# Patient Record
Sex: Male | Born: 1950
Health system: Southern US, Community
[De-identification: ages and names within clinical notes are randomized; demographics above are authoritative.]

## PROBLEM LIST (undated history)

## (undated) DIAGNOSIS — E785 Hyperlipidemia, unspecified: Secondary | ICD-10-CM

## (undated) DIAGNOSIS — I4892 Unspecified atrial flutter: Secondary | ICD-10-CM

## (undated) HISTORY — DX: Unspecified atrial flutter: I48.92

## (undated) HISTORY — DX: Hyperlipidemia, unspecified: E78.5

## (undated) HISTORY — PX: TONSILLECTOMY AND ADENOIDECTOMY: SHX28

---

## 2000-07-09 ENCOUNTER — Ambulatory Visit (HOSPITAL_COMMUNITY): Admission: RE | Admit: 2000-07-09 | Discharge: 2000-07-09 | Payer: Self-pay | Admitting: Pulmonary Disease

## 2001-05-01 ENCOUNTER — Ambulatory Visit (HOSPITAL_COMMUNITY): Admission: RE | Admit: 2001-05-01 | Discharge: 2001-05-01 | Payer: Self-pay | Admitting: Pulmonary Disease

## 2001-07-22 ENCOUNTER — Ambulatory Visit (HOSPITAL_COMMUNITY): Admission: RE | Admit: 2001-07-22 | Discharge: 2001-07-22 | Payer: Self-pay | Admitting: Pulmonary Disease

## 2001-08-11 ENCOUNTER — Encounter: Payer: Self-pay | Admitting: Otolaryngology

## 2001-08-11 ENCOUNTER — Ambulatory Visit (HOSPITAL_COMMUNITY): Admission: RE | Admit: 2001-08-11 | Discharge: 2001-08-11 | Payer: Self-pay | Admitting: Otolaryngology

## 2001-09-16 ENCOUNTER — Ambulatory Visit (HOSPITAL_COMMUNITY): Admission: RE | Admit: 2001-09-16 | Discharge: 2001-09-16 | Payer: Self-pay | Admitting: Pulmonary Disease

## 2002-07-09 ENCOUNTER — Encounter: Payer: Self-pay | Admitting: Internal Medicine

## 2002-07-09 ENCOUNTER — Inpatient Hospital Stay (HOSPITAL_COMMUNITY): Admission: EM | Admit: 2002-07-09 | Discharge: 2002-07-10 | Payer: Self-pay | Admitting: Internal Medicine

## 2004-09-21 ENCOUNTER — Ambulatory Visit (HOSPITAL_COMMUNITY): Admission: RE | Admit: 2004-09-21 | Discharge: 2004-09-21 | Payer: Self-pay | Admitting: Pulmonary Disease

## 2004-10-19 ENCOUNTER — Ambulatory Visit (HOSPITAL_COMMUNITY): Admission: RE | Admit: 2004-10-19 | Discharge: 2004-10-19 | Payer: Self-pay | Admitting: Pulmonary Disease

## 2010-05-23 ENCOUNTER — Other Ambulatory Visit (HOSPITAL_COMMUNITY): Payer: Self-pay | Admitting: Pulmonary Disease

## 2010-05-23 ENCOUNTER — Ambulatory Visit (HOSPITAL_COMMUNITY)
Admission: RE | Admit: 2010-05-23 | Discharge: 2010-05-23 | Disposition: A | Discharge status: Home / Self Care | Payer: PRIVATE HEALTH INSURANCE | Source: Ambulatory Visit | Attending: Pulmonary Disease | Admitting: Pulmonary Disease

## 2010-05-23 DIAGNOSIS — M79646 Pain in unspecified finger(s): Secondary | ICD-10-CM

## 2010-05-23 DIAGNOSIS — M25531 Pain in right wrist: Secondary | ICD-10-CM

## 2010-05-23 DIAGNOSIS — M25539 Pain in unspecified wrist: Secondary | ICD-10-CM | POA: Insufficient documentation

## 2010-05-23 DIAGNOSIS — M79609 Pain in unspecified limb: Secondary | ICD-10-CM | POA: Insufficient documentation

## 2014-02-16 ENCOUNTER — Encounter: Payer: Self-pay | Admitting: *Deleted

## 2014-02-16 DIAGNOSIS — E785 Hyperlipidemia, unspecified: Secondary | ICD-10-CM | POA: Insufficient documentation

## 2014-02-16 DIAGNOSIS — E669 Obesity, unspecified: Secondary | ICD-10-CM | POA: Insufficient documentation

## 2014-02-17 ENCOUNTER — Encounter: Payer: Self-pay | Admitting: Cardiology

## 2014-02-17 ENCOUNTER — Ambulatory Visit (INDEPENDENT_AMBULATORY_CARE_PROVIDER_SITE_OTHER): Payer: Medicare Other | Admitting: Cardiology

## 2014-02-17 VITALS — BP 140/100 | HR 86 | Ht 68.0 in | Wt 257.0 lb

## 2014-02-17 DIAGNOSIS — E785 Hyperlipidemia, unspecified: Secondary | ICD-10-CM

## 2014-02-17 DIAGNOSIS — I4892 Unspecified atrial flutter: Secondary | ICD-10-CM

## 2014-02-17 DIAGNOSIS — I483 Typical atrial flutter: Secondary | ICD-10-CM

## 2014-02-17 DIAGNOSIS — R0602 Shortness of breath: Secondary | ICD-10-CM

## 2014-02-17 DIAGNOSIS — Z7189 Other specified counseling: Secondary | ICD-10-CM

## 2014-02-17 DIAGNOSIS — Z1329 Encounter for screening for other suspected endocrine disorder: Secondary | ICD-10-CM

## 2014-02-17 MED ORDER — FUROSEMIDE 20 MG PO TABS: 20.0000 mg | ORAL_TABLET | Freq: Every day | ORAL | Status: DC

## 2014-02-17 MED ORDER — METOPROLOL TARTRATE 25 MG PO TABS: 25.0000 mg | ORAL_TABLET | Freq: Two times a day (BID) | ORAL | Status: DC

## 2014-02-17 NOTE — Patient Instructions (Signed)
Your physician recommends that you schedule a follow-up appointment in: 3-4 weeks with Dr. Harl Bowie  Your physician has requested that you have an echocardiogram. Echocardiography is a painless test that uses sound waves to create images of your heart. It provides your doctor with information about the size and shape of your heart and how well your heart's chambers and valves are working. This procedure takes approximately one hour. There are no restrictions for this procedure.  Your physician has recommended you make the following change in your medication:   START LOPRESSOR 25 MG TWICE DAILY  START LASIX 20 MG DAILY AS NEEDED FOR SWELLING AND SHORTNESS OF BREATH. TAKE FOR 3 DAYS EVERYDAY THEN TAKE AS NEEDED AFTER  Your physician recommends that you return for lab work in: 1 WEEK BMP/TSH/LIPIDS   Thank you for choosing [facility]!!

## 2014-02-17 NOTE — Progress Notes (Signed)
Clinical Summary Mr. Novosad is a 64 y.o.male seen today as a new patient for the following medical problems.   1. SOB - ongoing for last 7-8 months. Occurs with exertion. [place] trail [place] about [place] mile, notes at 3/4 of the way he has SOB.Also noted recent  [employer]  walking form parking lot to football stadium during recent outing.  - no edema, no orthopnea, no PND. No palpitations. No chest pain  - elevated BNP noted by pcp   2. Hyperlipidemia - muscle aches on zocor. Had stopped lipitor for a period, restarted Nov 2015.  - Last panel May 2015 TC 215 HDL 46 LDL 136   PMH 1. Hyperlipidemia   No Known Allergies   Current Outpatient Prescriptions  Medication Sig Dispense Refill  . aspirin 81 MG tablet Take 81 mg by mouth daily.    Marland Kitchen atorvastatin (LIPITOR) 40 MG tablet Take 40 mg by mouth daily.    . Cholecalciferol (VITAMIN D-3 PO) Take 5,000 Int'l Units by mouth.    . Lecithin 1200 MG CAPS Take 1,200 mg by mouth daily.    Marland Kitchen loratadine (CLARITIN) 10 MG tablet Take 10 mg by mouth daily.    . Misc Natural Products (GINKOGIN PO) Take 240 mg by mouth daily.    . Omega-3 Fatty Acids (FISH OIL) 1000 MG CAPS Take 1,000 mg by mouth daily.    Marland Kitchen omeprazole (PRILOSEC) 20 MG capsule Take 20 mg by mouth daily.    . vitamin C (ASCORBIC ACID) 500 MG tablet Take 500 mg by mouth daily.    . Vitamin E (VITA-PLUS E PO) Take 800 mg by mouth.    . Zinc 50 MG TABS Take 50 mg by mouth daily.    . furosemide (LASIX) 20 MG tablet Take 1 tablet (20 mg total) by mouth daily. 90 tablet 3  . metoprolol tartrate (LOPRESSOR) 25 MG tablet Take 1 tablet (25 mg total) by mouth 2 (two) times daily. 180 tablet 3   No current facility-administered medications for this visit.     No past surgical history on file.   No Known Allergies    Denies family history of cardaic problems.   Social History Mr. Talton reports that he has never smoked. He has never used smokeless  tobacco. Mr. Ankney has no alcohol history on file.   Review of Systems CONSTITUTIONAL: No weight loss, fever, chills, weakness or fatigue.  HEENT: Eyes: No visual loss, blurred vision, double vision or yellow sclerae.No hearing loss, sneezing, congestion, runny nose or sore throat.  SKIN: No rash or itching.  CARDIOVASCULAR: per HPI RESPIRATORY: No cough or sputum.  GASTROINTESTINAL: No anorexia, nausea, vomiting or diarrhea. No abdominal pain or blood.  GENITOURINARY: No burning on urination, no polyuria NEUROLOGICAL: No headache, dizziness, syncope, paralysis, ataxia, numbness or tingling in the extremities. No change in bowel or bladder control.  MUSCULOSKELETAL: No muscle, back pain, joint pain or stiffness.  LYMPHATICS: No enlarged nodes. No history of splenectomy.  PSYCHIATRIC: No history of depression or anxiety.  ENDOCRINOLOGIC: No reports of sweating, cold or heat intolerance. No polyuria or polydipsia.  Marland Kitchen   Physical Examination Filed Vitals:   02/17/14 0843  BP: 140/100  Pulse: 86   Filed Weights   02/17/14 0843  Weight: 257 lb (116.574 kg)    Gen: resting comfortably, no acute distress HEENT: no scleral icterus, pupils equal round and reactive, no palptable cervical adenopathy,  CV: RRR,  no m/r/g, no JVD, no carotid bruits Resp: Clear to auscultation bilaterally GI: abdomen is soft, non-tender, non-distended, normal bowel sounds, no hepatosplenomegaly MSK: extremities are warm, no edema.  Skin: warm, no rash Neuro:  no focal deficits Psych: appropriate affect   Clinic EKG: aflutter  Assessment and Plan  1. SOB - likely partly related to new diagnosis of aflutter - f/u echo results  2. Aflutter - new diagnosis this clinic visit - patient denies any SOB, however baseline heart rates elevated to 112. Likely partly responsible for his SOB - check BMET, TSH, echo - start lopressor 25mg  bid for now, discuss possible ablation at follow up. Therapy strategy  may also be affected pending echo results - CHADS2Vasc score is 1 (age), continue ASA at this time. Pending echo results may classify as higher risk.   3. Hyperlipidemia - continue statin, repeat FLP now that he has been consistently back on statin   F/u 3-4 weeks   Arnoldo Lenis, M.D..

## 2014-02-19 DIAGNOSIS — I4892 Unspecified atrial flutter: Secondary | ICD-10-CM | POA: Insufficient documentation

## 2014-02-24 ENCOUNTER — Other Ambulatory Visit: Payer: Self-pay

## 2014-02-24 ENCOUNTER — Other Ambulatory Visit (INDEPENDENT_AMBULATORY_CARE_PROVIDER_SITE_OTHER): Payer: Medicare Other

## 2014-02-24 DIAGNOSIS — R0602 Shortness of breath: Secondary | ICD-10-CM

## 2014-02-24 DIAGNOSIS — I4892 Unspecified atrial flutter: Secondary | ICD-10-CM

## 2014-02-24 DIAGNOSIS — I483 Typical atrial flutter: Secondary | ICD-10-CM

## 2014-02-25 ENCOUNTER — Encounter: Payer: Self-pay | Admitting: *Deleted

## 2014-02-25 ENCOUNTER — Telehealth: Payer: Self-pay | Admitting: *Deleted

## 2014-02-25 NOTE — Telephone Encounter (Signed)
Letter mailed to pt. Both contact numbers disconnected

## 2014-02-25 NOTE — Telephone Encounter (Signed)
-----   Message from Arnoldo Lenis, MD sent at 02/25/2014 11:46 AM EST ----- Echo looks good, heart functions is normal  Zandra Abts MD

## 2014-03-02 ENCOUNTER — Telehealth: Payer: Self-pay | Admitting: *Deleted

## 2014-03-02 NOTE — Telephone Encounter (Signed)
Pt called to update phone number, corrected number under demographics. Also pt made aware of echo results

## 2014-03-03 ENCOUNTER — Other Ambulatory Visit: Payer: Self-pay | Admitting: Cardiology

## 2014-03-03 LAB — LIPID PANEL
CHOL/HDL RATIO: 4.6 ratio
CHOLESTEROL: 172 mg/dL (ref 0–200)
HDL: 37 mg/dL — ABNORMAL LOW (ref >39)
LDL Cholesterol: 114 mg/dL — ABNORMAL HIGH (ref 0–99)
TRIGLYCERIDES: 103 mg/dL (ref <150)
VLDL: 21 mg/dL (ref 0–40)

## 2014-03-03 LAB — BASIC METABOLIC PANEL
BUN: 21 mg/dL (ref 6–23)
CHLORIDE: 105 meq/L (ref 96–112)
CO2: 27 meq/L (ref 19–32)
CREATININE: 1.03 mg/dL (ref 0.50–1.35)
Calcium: 8.8 mg/dL (ref 8.4–10.5)
Glucose, Bld: 133 mg/dL — ABNORMAL HIGH (ref 70–99)
Potassium: 4.5 mEq/L (ref 3.5–5.3)
SODIUM: 141 meq/L (ref 135–145)

## 2014-03-03 LAB — TSH: TSH: 2.635 u[IU]/mL (ref 0.350–4.500)

## 2014-03-09 ENCOUNTER — Encounter: Payer: Self-pay | Admitting: *Deleted

## 2014-03-10 ENCOUNTER — Encounter: Payer: Self-pay | Admitting: Cardiology

## 2014-03-10 ENCOUNTER — Ambulatory Visit (INDEPENDENT_AMBULATORY_CARE_PROVIDER_SITE_OTHER): Payer: Medicare Other | Admitting: Cardiology

## 2014-03-10 VITALS — BP 131/84 | HR 108 | Ht 68.0 in | Wt 261.0 lb

## 2014-03-10 DIAGNOSIS — I4892 Unspecified atrial flutter: Secondary | ICD-10-CM

## 2014-03-10 MED ORDER — METOPROLOL TARTRATE 50 MG PO TABS: 50.0000 mg | ORAL_TABLET | Freq: Two times a day (BID) | ORAL | Status: DC

## 2014-03-10 NOTE — Patient Instructions (Signed)
Your physician recommends that you schedule a follow-up appointment in: 6 weeks with Dr. Harl Bowie  Your physician has recommended you make the following change in your medication:   INCREASE LOPRESSOR 50 Rossie AS DIRECTED  Thank you for choosing [facility]!!

## 2014-03-10 NOTE — Progress Notes (Signed)
Clinical Summary Mr. Antrim is a 64 y.o.male seen today for follow up of the following medical problems. This is a focused visit on his history of aflutter/afib.   1. Aflutter - noted last visit, started on beta blocker. CHADS2Vasc score of 1, started on ASA.   - echo LVEF 50-55%, mild LVH, could not evaluate diastolic function - TSH 4.25, K 4.5 - still with some exertional dyspnea, no palpitations.     Past Medical History  Diagnosis Date  . Atrial flutter   . Hyperlipidemia      No Known Allergies   Current Outpatient Prescriptions  Medication Sig Dispense Refill  . aspirin 81 MG tablet Take 81 mg by mouth daily.    Marland Kitchen atorvastatin (LIPITOR) 40 MG tablet Take 40 mg by mouth daily.    . Cholecalciferol (VITAMIN D-3 PO) Take 5,000 Int'l Units by mouth.    . furosemide (LASIX) 20 MG tablet Take 1 tablet (20 mg total) by mouth daily. 90 tablet 3  . Lecithin 1200 MG CAPS Take 1,200 mg by mouth daily.    Marland Kitchen loratadine (CLARITIN) 10 MG tablet Take 10 mg by mouth daily.    . metoprolol tartrate (LOPRESSOR) 25 MG tablet Take 1 tablet (25 mg total) by mouth 2 (two) times daily. 180 tablet 3  . Misc Natural Products (GINKOGIN PO) Take 240 mg by mouth daily.    . Omega-3 Fatty Acids (FISH OIL) 1000 MG CAPS Take 1,000 mg by mouth daily.    Marland Kitchen omeprazole (PRILOSEC) 20 MG capsule Take 20 mg by mouth daily.    . vitamin C (ASCORBIC ACID) 500 MG tablet Take 500 mg by mouth daily.    . Vitamin E (VITA-PLUS E PO) Take 800 mg by mouth.    . Zinc 50 MG TABS Take 50 mg by mouth daily.     No current facility-administered medications for this visit.     No past surgical history on file.   No Known Allergies    No family history on file.   Social History Mr. Stillings reports that he has never smoked. He has never used smokeless tobacco. Mr. Sortor has no alcohol history on file.   Review of Systems CONSTITUTIONAL: No weight loss, fever, chills, weakness or fatigue.    HEENT: Eyes: No visual loss, blurred vision, double vision or yellow sclerae.No hearing loss, sneezing, congestion, runny nose or sore throat.  SKIN: No rash or itching.  CARDIOVASCULAR: no chest pain, no palpitaitons.  RESPIRATORY: No cough or sputum.  GASTROINTESTINAL: No anorexia, nausea, vomiting or diarrhea. No abdominal pain or blood.  GENITOURINARY: No burning on urination, no polyuria NEUROLOGICAL: No headache, dizziness, syncope, paralysis, ataxia, numbness or tingling in the extremities. No change in bowel or bladder control.  MUSCULOSKELETAL: No muscle, back pain, joint pain or stiffness.  LYMPHATICS: No enlarged nodes. No history of splenectomy.  PSYCHIATRIC: No history of depression or anxiety.  ENDOCRINOLOGIC: No reports of sweating, cold or heat intolerance. No polyuria or polydipsia.  Marland Kitchen   Physical Examination p 108 bp 131/84 Wt 261 lbs BMI 39 Gen: resting comfortably, no acute distress HEENT: no scleral icterus, pupils equal round and reactive, no palptable cervical adenopathy,  CV: irreg, no m/r/g, no JVD Resp: Clear to auscultation bilaterally GI: abdomen is soft, non-tender, non-distended, normal bowel sounds, no hepatosplenomegaly MSK: extremities are warm, no edema.  Skin: warm, no rash Neuro:  no focal deficits Psych: appropriate affect   Diagnostic Studies  02/2014 Echo Study  Conclusions  - Procedure narrative: Transthoracic echocardiography for left ventricular function evaluation, for right ventricular function evaluation, and for assessment of valvular function. Image quality was suboptimal, with poor valvular and endocardial visualization. Patient was tachycardic throughout exam. - Left ventricle: There was mild concentric hypertrophy. Systolic function was normal. The estimated ejection fraction was in the range of 50% to 55%. Images were inadequate for LV wall motion assessment. The study was not technically sufficient to  allow evaluation of LV diastolic dysfunction due to atrial flutter. - Mitral valve: Mildly calcified annulus. Mildly thickened leaflets . There was trivial regurgitation. - Left atrium: The atrium was mildly dilated. - Tricuspid valve: There was trivial regurgitation.   Assessment and Plan  1. Aflutter - new diagnosis last clinic visit, still with some exertional SOB. Will increase lopressor to 50mg  bid. Pending clinical response will discuss potential ablation at next follow up.  - CHADS2Vasc score is 1 (age), continue ASA at this time. Pending echo results may classify as higher risk.          Arnoldo Lenis, M.D.

## 2014-04-26 ENCOUNTER — Ambulatory Visit: Payer: Medicare Other | Admitting: Cardiology

## 2014-05-17 ENCOUNTER — Ambulatory Visit (INDEPENDENT_AMBULATORY_CARE_PROVIDER_SITE_OTHER): Payer: Medicare Other | Admitting: Cardiology

## 2014-05-17 ENCOUNTER — Encounter: Payer: Self-pay | Admitting: Cardiology

## 2014-05-17 VITALS — BP 143/94 | HR 107 | Temp 98.6°F | Ht 68.0 in | Wt 267.0 lb

## 2014-05-17 DIAGNOSIS — I4891 Unspecified atrial fibrillation: Secondary | ICD-10-CM

## 2014-05-17 DIAGNOSIS — I4892 Unspecified atrial flutter: Secondary | ICD-10-CM

## 2014-05-17 DIAGNOSIS — I483 Typical atrial flutter: Secondary | ICD-10-CM

## 2014-05-17 MED ORDER — METOPROLOL TARTRATE 100 MG PO TABS: 100.0000 mg | ORAL_TABLET | Freq: Two times a day (BID) | ORAL | Status: DC

## 2014-05-17 NOTE — Progress Notes (Signed)
Clinical Summary [facility] is a 64 y.o.male seen today for follow up of the following medical problems.    1. Aflutter/Afib - rate control with lopressor. Last visit increased lopressor to 50mg  bid. CHADS2Vasc score of 1, started on ASA.  - echo LVEF 50-55%, mild LVH, could not evaluate diastolic function - TSH 2.84, K 4.5 - still with some exertional dyspnea, no palpitations.    Past Medical History  Diagnosis Date  . Atrial flutter   . Hyperlipidemia      Allergies  Allergen Reactions  . Cefuroxime Axetil Nausea And Vomiting    Upset stomach     Current Outpatient Prescriptions  Medication Sig Dispense Refill  . aspirin 81 MG tablet Take 81 mg by mouth daily.    Marland Kitchen atorvastatin (LIPITOR) 40 MG tablet Take 40 mg by mouth daily.    . Cholecalciferol (VITAMIN D-3 PO) Take 5,000 Int'l Units by mouth.    . Coenzyme Q10 (COQ-10) 400 MG CAPS Take 1 capsule by mouth daily.    . furosemide (LASIX) 20 MG tablet Take 1 tablet (20 mg total) by mouth daily. 90 tablet 3  . Lecithin 1200 MG CAPS Take 1,200 mg by mouth daily.    Marland Kitchen loratadine (CLARITIN) 10 MG tablet Take 10 mg by mouth daily.    . metoprolol (LOPRESSOR) 50 MG tablet Take 1 tablet (50 mg total) by mouth 2 (two) times daily. 60 tablet 6  . Misc Natural Products (GINKOGIN PO) Take 240 mg by mouth daily.    . Omega-3 Fatty Acids (FISH OIL) 1000 MG CAPS Take 1,000 mg by mouth daily.    Marland Kitchen omeprazole (PRILOSEC) 20 MG capsule Take 20 mg by mouth daily.    . vitamin C (ASCORBIC ACID) 500 MG tablet Take 500 mg by mouth daily.    . Vitamin E (VITA-PLUS E PO) Take 800 mg by mouth.    . Zinc 50 MG TABS Take 50 mg by mouth daily.     No current facility-administered medications for this visit.     No past surgical history on file.   Allergies  Allergen Reactions  . Cefuroxime Axetil Nausea And Vomiting    Upset stomach      No family history on file.   Social History [facility] reports that he has never  smoked. He has never used smokeless tobacco. [facility] has no alcohol history on file.   Review of Systems CONSTITUTIONAL: generalized fatigue HEENT: Eyes: No visual loss, blurred vision, double vision or yellow sclerae.No hearing loss, sneezing, congestion, runny nose or sore throat.  SKIN: No rash or itching.  CARDIOVASCULAR: per HPI RESPIRATORY: No shortness of breath, cough or sputum.  GASTROINTESTINAL: No anorexia, nausea, vomiting or diarrhea. No abdominal pain or blood.  GENITOURINARY: No burning on urination, no polyuria NEUROLOGICAL: No headache, dizziness, syncope, paralysis, ataxia, numbness or tingling in the extremities. No change in bowel or bladder control.  MUSCULOSKELETAL: No muscle, back pain, joint pain or stiffness.  LYMPHATICS: No enlarged nodes. No history of splenectomy.  PSYCHIATRIC: No history of depression or anxiety.  ENDOCRINOLOGIC: No reports of sweating, cold or heat intolerance. No polyuria or polydipsia.  Marland Kitchen   Physical Examination p 107 bp 143/94 Wt 267 lbs BMI 41 Gen: resting comfortably, no acute distress HEENT: no scleral icterus, pupils equal round and reactive, no palptable cervical adenopathy,  CV: irreg, rate 110, no m/r/g, no JVD Resp: Clear to auscultation bilaterally GI: abdomen is soft, non-tender, non-distended, normal  bowel sounds, no hepatosplenomegaly MSK: extremities are warm, no edema.  Skin: warm, no rash Neuro:  no focal deficits Psych: appropriate affect   Diagnostic Studies 02/2014 Echo Study Conclusions  - Procedure narrative: Transthoracic echocardiography for left ventricular function evaluation, for right ventricular function evaluation, and for assessment of valvular function. Image quality was suboptimal, with poor valvular and endocardial visualization. Patient was tachycardic throughout exam. - Left ventricle: There was mild concentric hypertrophy. Systolic function was normal. The estimated ejection  fraction was in the range of 50% to 55%. Images were inadequate for LV wall motion assessment. The study was not technically sufficient to allow evaluation of LV diastolic dysfunction due to atrial flutter. - Mitral valve: Mildly calcified annulus. Mildly thickened leaflets . There was trivial regurgitation. - Left atrium: The atrium was mildly dilated. - Tricuspid valve: There was trivial regurgitation.    Assessment and Plan  1. Aflutter/Afib - clinic EKG with elevated rates today to 130s, continues to have generalized and exertional fatigue - increase lopressor to 100mg  bid. If cannot control rates will need to consider antiarrhythmic strategy at next visit with possible TEE/DCCV.  - CHADS2Vasc score is 1 (age), continue ASA at this time. If antiarrhythmic strategy pursued would need to switch to anticoag for a period of time.    F/u 2 weeks   Arnoldo Lenis, M.D.

## 2014-05-17 NOTE — Patient Instructions (Addendum)
   Increase Lopressor to 100mg  twice a day  - may take 2 of your 50mg  tabs twice a day till finish current supply - new sent to pharm today. Continue all other medications.   Early follow up after 05/28/2014.

## 2014-06-02 ENCOUNTER — Ambulatory Visit (INDEPENDENT_AMBULATORY_CARE_PROVIDER_SITE_OTHER): Payer: Medicare Other | Admitting: Cardiology

## 2014-06-02 ENCOUNTER — Encounter: Payer: Self-pay | Admitting: Cardiology

## 2014-06-02 VITALS — BP 128/82 | HR 62 | Ht 68.0 in | Wt 263.0 lb

## 2014-06-02 DIAGNOSIS — I483 Typical atrial flutter: Secondary | ICD-10-CM

## 2014-06-02 DIAGNOSIS — I480 Paroxysmal atrial fibrillation: Secondary | ICD-10-CM | POA: Diagnosis not present

## 2014-06-02 NOTE — Progress Notes (Signed)
Clinical Summary Mr. Kniskern is a 64 y.o.male seen today for follow up of the following medical problems.   1. Aflutter/Afib - rate control with lopressor. Last visit increased lopressor to 100 mg bid. CHADS2Vasc score of 1, he has been on ASA.  - echo LVEF 50-55%, mild LVH, could not evaluate diastolic function - TSH 8.18, K 4.5  - since last visit denies any recent palpitations. Energy level has increased significantly.    2. OSA screen - no snoring. No daytime somnolence. No observed apneic episodes.   Past Medical History  Diagnosis Date  . Atrial flutter   . Hyperlipidemia      Allergies  Allergen Reactions  . Cefuroxime Axetil Nausea And Vomiting    Upset stomach     Current Outpatient Prescriptions  Medication Sig Dispense Refill  . aspirin 81 MG tablet Take 81 mg by mouth daily.    Marland Kitchen atorvastatin (LIPITOR) 40 MG tablet Take 40 mg by mouth daily.    . Cholecalciferol (VITAMIN D-3 PO) Take 5,000 Int'l Units by mouth.    . Coenzyme Q10 (COQ-10) 400 MG CAPS Take 1 capsule by mouth daily.    . furosemide (LASIX) 20 MG tablet Take 1 tablet (20 mg total) by mouth daily. 90 tablet 3  . Lecithin 1200 MG CAPS Take 1,200 mg by mouth daily.    Marland Kitchen loratadine (CLARITIN) 10 MG tablet Take 10 mg by mouth daily.    . metoprolol (LOPRESSOR) 100 MG tablet Take 1 tablet (100 mg total) by mouth 2 (two) times daily. 60 tablet 6  . Misc Natural Products (GINKOGIN PO) Take 240 mg by mouth daily.    . Omega-3 Fatty Acids (FISH OIL) 1000 MG CAPS Take 1,000 mg by mouth daily.    Marland Kitchen omeprazole (PRILOSEC) 20 MG capsule Take 20 mg by mouth daily.    . vitamin C (ASCORBIC ACID) 500 MG tablet Take 500 mg by mouth daily.    . Vitamin E (VITA-PLUS E PO) Take 800 mg by mouth.    . Zinc 50 MG TABS Take 50 mg by mouth daily.     No current facility-administered medications for this visit.     No past surgical history on file.   Allergies  Allergen Reactions  . Cefuroxime Axetil  Nausea And Vomiting    Upset stomach      No family history on file.   Social History Mr. Hylton reports that he has never smoked. He has never used smokeless tobacco. Mr. Klees has no alcohol history on file.   Review of Systems CONSTITUTIONAL: No weight loss, fever, chills, weakness or fatigue.  HEENT: Eyes: No visual loss, blurred vision, double vision or yellow sclerae.No hearing loss, sneezing, congestion, runny nose or sore throat.  SKIN: No rash or itching.  CARDIOVASCULAR: no chest pain, no palpitations RESPIRATORY: No shortness of breath, cough or sputum.  GASTROINTESTINAL: No anorexia, nausea, vomiting or diarrhea. No abdominal pain or blood.  GENITOURINARY: No burning on urination, no polyuria NEUROLOGICAL: No headache, dizziness, syncope, paralysis, ataxia, numbness or tingling in the extremities. No change in bowel or bladder control.  MUSCULOSKELETAL: No muscle, back pain, joint pain or stiffness.  LYMPHATICS: No enlarged nodes. No history of splenectomy.  PSYCHIATRIC: No history of depression or anxiety.  ENDOCRINOLOGIC: No reports of sweating, cold or heat intolerance. No polyuria or polydipsia.  Marland Kitchen   Physical Examination p 62 bp 128/62 Wt 263 lbs BMI 40 Gen: resting comfortably, no acute distress HEENT:  no scleral icterus, pupils equal round and reactive, no palptable cervical adenopathy,  CV: RRR, no m/r/g, no JVD, no carotid bruits Resp: Clear to auscultation bilaterally GI: abdomen is soft, non-tender, non-distended, normal bowel sounds, no hepatosplenomegaly MSK: extremities are warm, no edema.  Skin: warm, no rash Neuro:  no focal deficits Psych: appropriate affect   Diagnostic Studies  02/2014 Echo Study Conclusions  - Procedure narrative: Transthoracic echocardiography for left ventricular function evaluation, for right ventricular function evaluation, and for assessment of valvular function. Image quality was suboptimal, with poor  valvular and endocardial visualization. Patient was tachycardic throughout exam. - Left ventricle: There was mild concentric hypertrophy. Systolic function was normal. The estimated ejection fraction was in the range of 50% to 55%. Images were inadequate for LV wall motion assessment. The study was not technically sufficient to allow evaluation of LV diastolic dysfunction due to atrial flutter. - Mitral valve: Mildly calcified annulus. Mildly thickened leaflets . There was trivial regurgitation. - Left atrium: The atrium was mildly dilated. - Tricuspid valve: There was trivial regurgitation.   Assessment and Plan  1. Aflutter/Afib - clinic EKG shows he has converted back to NSR, symptoms significantly improved since increasing lopressor to 100mg  bid last visit. - CHADS2Vasc score is 1 (age), continue ASA     F/u 3 months    Arnoldo Lenis, M.D.

## 2014-06-02 NOTE — Patient Instructions (Signed)
Your physician recommends that you schedule a follow-up appointment in: 3 months with Dr. Harl Bowie in [place] recommends that you continue on your current medications as directed. Please refer to the Current Medication list given to you today.  Thank you for choosing [facility]!

## 2014-09-01 ENCOUNTER — Encounter: Payer: Self-pay | Admitting: Cardiology

## 2014-09-01 ENCOUNTER — Ambulatory Visit (INDEPENDENT_AMBULATORY_CARE_PROVIDER_SITE_OTHER): Payer: Medicare Other | Admitting: Cardiology

## 2014-09-01 VITALS — BP 118/74 | HR 64 | Ht 68.0 in | Wt 270.2 lb

## 2014-09-01 DIAGNOSIS — I480 Paroxysmal atrial fibrillation: Secondary | ICD-10-CM

## 2014-09-01 MED ORDER — DILTIAZEM HCL 30 MG PO TABS: 30.0000 mg | ORAL_TABLET | Freq: Two times a day (BID) | ORAL | Status: DC

## 2014-09-01 NOTE — Progress Notes (Signed)
Patient ID: Arthur Le, male   DOB: 07-15-1950, 64 y.o.   MRN: [id number]     Clinical Summary Mr. Flenner is a 64 y.o.male seen today for follow up of the following medical problems.  1. Aflutter/Afib - rate control with lopressor. Last visit increased lopressor to 100 mg bid. CHADS2Vasc score of 1, he has been on ASA.  - echo LVEF 50-55%, mild LVH, could not evaluate diastolic function - TSH 6.16, K 4.5 - notes some palpitations since last visit. Often taking additional 50mg  of lopressor, which does help resolve symptoms.   2. OSA screen - no snoring. No daytime somnolence. No observed apneic episodes      Past Medical History  Diagnosis Date  . Atrial flutter   . Hyperlipidemia      Allergies  Allergen Reactions  . Cefuroxime Axetil Nausea And Vomiting    Upset stomach     Current Outpatient Prescriptions  Medication Sig Dispense Refill  . aspirin 81 MG tablet Take 81 mg by mouth daily.    Marland Kitchen atorvastatin (LIPITOR) 40 MG tablet Take 40 mg by mouth daily.    . Cholecalciferol (VITAMIN D-3 PO) Take 5,000 Int'l Units by mouth.    . Coenzyme Q10 (COQ-10) 400 MG CAPS Take 1 capsule by mouth daily.    . furosemide (LASIX) 20 MG tablet Take 1 tablet (20 mg total) by mouth daily. 90 tablet 3  . Lecithin 1200 MG CAPS Take 1,200 mg by mouth daily.    Marland Kitchen loratadine (CLARITIN) 10 MG tablet Take 10 mg by mouth daily.    . metoprolol (LOPRESSOR) 100 MG tablet Take 1 tablet (100 mg total) by mouth 2 (two) times daily. 60 tablet 6  . Misc Natural Products (GINKOGIN PO) Take 240 mg by mouth daily.    . Omega-3 Fatty Acids (FISH OIL) 1000 MG CAPS Take 1,000 mg by mouth daily.    Marland Kitchen omeprazole (PRILOSEC) 20 MG capsule Take 20 mg by mouth daily.    . vitamin C (ASCORBIC ACID) 500 MG tablet Take 500 mg by mouth daily.    . Vitamin E (VITA-PLUS E PO) Take 800 mg by mouth.    . Zinc 50 MG TABS Take 50 mg by mouth daily.     No current facility-administered medications for this  visit.     No past surgical history on file.   Allergies  Allergen Reactions  . Cefuroxime Axetil Nausea And Vomiting    Upset stomach      No family history on file.   Social History Mr. Martelle reports that he has never smoked. He has never used smokeless tobacco. Mr. Kowal has no alcohol history on file.   Review of Systems CONSTITUTIONAL: No weight loss, fever, chills, weakness or fatigue.  HEENT: Eyes: No visual loss, blurred vision, double vision or yellow sclerae.No hearing loss, sneezing, congestion, runny nose or sore throat.  SKIN: No rash or itching.  CARDIOVASCULAR: per HPI RESPIRATORY: No shortness of breath, cough or sputum.  GASTROINTESTINAL: No anorexia, nausea, vomiting or diarrhea. No abdominal pain or blood.  GENITOURINARY: No burning on urination, no polyuria NEUROLOGICAL: No headache, dizziness, syncope, paralysis, ataxia, numbness or tingling in the extremities. No change in bowel or bladder control.  MUSCULOSKELETAL: No muscle, back pain, joint pain or stiffness.  LYMPHATICS: No enlarged nodes. No history of splenectomy.  PSYCHIATRIC: No history of depression or anxiety.  ENDOCRINOLOGIC: No reports of sweating, cold or heat intolerance. No polyuria or polydipsia.  Marland Kitchen  Physical Examination Filed Vitals:   09/01/14 0855  BP: 118/74  Pulse: 64   Filed Vitals:   09/01/14 0855  Height: 5\' 8"  (1.727 m)  Weight: 270 lb 3.2 oz (122.562 kg)    Gen: resting comfortably, no acute distress HEENT: no scleral icterus, pupils equal round and reactive, no palptable cervical adenopathy,  CV: RRR, no m/r/g, no JVD Resp: Clear to auscultation bilaterally GI: abdomen is soft, non-tender, non-distended, normal bowel sounds, no hepatosplenomegaly MSK: extremities are warm, no edema.  Skin: warm, no rash Neuro:  no focal deficits Psych: appropriate affect   Diagnostic Studies 02/2014 Echo Study Conclusions  - Procedure narrative: Transthoracic  echocardiography for left ventricular function evaluation, for right ventricular function evaluation, and for assessment of valvular function. Image quality was suboptimal, with poor valvular and endocardial visualization. Patient was tachycardic throughout exam. - Left ventricle: There was mild concentric hypertrophy. Systolic function was normal. The estimated ejection fraction was in the range of 50% to 55%. Images were inadequate for LV wall motion assessment. The study was not technically sufficient to allow evaluation of LV diastolic dysfunction due to atrial flutter. - Mitral valve: Mildly calcified annulus. Mildly thickened leaflets . There was trivial regurgitation. - Left atrium: The atrium was mildly dilated. - Tricuspid valve: There was trivial regurgitation.     Assessment and Plan  1. Aflutter/Afib - notes some recent palpitations. Will add dilt 30mg  bid to regimen, asked to call us if symptoms continue and can titrate up diltiazem further.  - CHADS2Vasc score is 1 (age), continue ASA    F/u 4 months   Arnoldo Lenis, M.D.

## 2014-09-01 NOTE — Patient Instructions (Signed)
Your physician wants you to follow-up in: 4 months with Dr.Branch You will receive a reminder letter in the mail two months in advance. If you don't receive a letter, please call our office to schedule the follow-up appointment.    START Diltiazem 30 mg twice a day    Thank you for choosing [facility] !

## 2014-09-03 ENCOUNTER — Ambulatory Visit: Payer: Medicare Other | Admitting: Cardiology

## 2014-11-19 ENCOUNTER — Ambulatory Visit (INDEPENDENT_AMBULATORY_CARE_PROVIDER_SITE_OTHER): Payer: Medicare Other | Admitting: Cardiology

## 2014-11-19 ENCOUNTER — Encounter: Payer: Self-pay | Admitting: Cardiology

## 2014-11-19 VITALS — BP 132/80 | HR 74 | Ht 68.0 in | Wt 267.6 lb

## 2014-11-19 DIAGNOSIS — I4891 Unspecified atrial fibrillation: Secondary | ICD-10-CM

## 2014-11-19 MED ORDER — DILTIAZEM HCL ER COATED BEADS 120 MG PO CP24: 120.0000 mg | ORAL_CAPSULE | Freq: Every day | ORAL | Status: DC

## 2014-11-19 NOTE — Patient Instructions (Signed)
Your physician wants you to follow-up in: 4 months with Dr Bryna Colander will receive a reminder letter in the mail two months in advance. If you don't receive a letter, please call our office to schedule the follow-up appointment.    STOP Cardizem   START Diltiazem 120 mg daily      Thank you for choosing [facility] !

## 2014-11-19 NOTE — Progress Notes (Signed)
Patient ID: Arthur Le, male   DOB: 09/23/1950, 64 y.o.   MRN: [id number]     Clinical Summary Arthur Le is a 64 y.o.male seen today for follow up of the following medical problems.   1. Aflutter/Afib - continues to have palpitations at times. Tend to occur 2-6 hours after taking meds.    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 Atrial flutter   . Hyperlipidemia      Allergies  Allergen Reactions  . Cefuroxime Axetil Nausea And Vomiting    Upset stomach     Current Outpatient Prescriptions  Medication Sig Dispense Refill  . aspirin 81 MG tablet Take 81 mg by mouth daily.    Marland Kitchen atorvastatin (LIPITOR) 40 MG tablet Take 40 mg by mouth daily.    . Cholecalciferol (VITAMIN D-3 PO) Take 5,000 Int'l Units by mouth.    . Coenzyme Q10 (COQ-10) 400 MG CAPS Take 1 capsule by mouth daily.    Marland Kitchen diltiazem (CARDIZEM) 30 MG tablet Take 1 tablet (30 mg total) by mouth 2 (two) times daily. 180 tablet 3  . furosemide (LASIX) 20 MG tablet Take 1 tablet (20 mg total) by mouth daily. 90 tablet 3  . Lecithin 1200 MG CAPS Take 1,200 mg by mouth daily.    Marland Kitchen loratadine (CLARITIN) 10 MG tablet Take 10 mg by mouth daily.    . metoprolol (LOPRESSOR) 100 MG tablet Take 1 tablet (100 mg total) by mouth 2 (two) times daily. 60 tablet 6  . Misc Natural Products (GINKOGIN PO) Take 240 mg by mouth daily.    . Omega-3 Fatty Acids (FISH OIL) 1000 MG CAPS Take 1,000 mg by mouth daily.    Marland Kitchen omeprazole (PRILOSEC) 20 MG capsule Take 20 mg by mouth daily.    . vitamin C (ASCORBIC ACID) 500 MG tablet Take 500 mg by mouth daily.    . Vitamin E (VITA-PLUS E PO) Take 800 mg by mouth.    . Zinc 50 MG TABS Take 50 mg by mouth daily.     No current facility-administered medications for this visit.     No past surgical history on file.   Allergies  Allergen Reactions  . Cefuroxime Axetil Nausea And Vomiting    Upset stomach      No  family history on file.   Social History Arthur Le reports that he has never smoked. He has never used smokeless tobacco. Arthur Le has no alcohol history on file.   Review of Systems CONSTITUTIONAL: No weight loss, fever, chills, weakness or fatigue.  HEENT: Eyes: No visual loss, blurred vision, double vision or yellow sclerae.No hearing loss, sneezing, congestion, runny nose or sore throat.  SKIN: No rash or itching.  CARDIOVASCULAR: per HPI RESPIRATORY: No shortness of breath, cough or sputum.  GASTROINTESTINAL: No anorexia, nausea, vomiting or diarrhea. No abdominal pain or blood.  GENITOURINARY: No burning on urination, no polyuria NEUROLOGICAL: No headache, dizziness, syncope, paralysis, ataxia, numbness or tingling in the extremities. No change in bowel or bladder control.  MUSCULOSKELETAL: No muscle, back pain, joint pain or stiffness.  LYMPHATICS: No enlarged nodes. No history of splenectomy.  PSYCHIATRIC: No history of depression or anxiety.  ENDOCRINOLOGIC: No reports of sweating, cold or heat intolerance. No polyuria or polydipsia.  Marland Kitchen   Physical Examination Filed Vitals:   11/19/14 1054  BP: 132/80  Pulse: 74   Filed Vitals:   11/19/14 1054  Height: 5\' 8"  (1.727 m)  Weight: 267 lb 9.6 oz (121.383 kg)    Gen: resting comfortably, no acute distress HEENT: no scleral icterus, pupils equal round and reactive, no palptable cervical adenopathy,  CV: RRR, no m/r/g, no jvd Resp: Clear to auscultation bilaterally GI: abdomen is soft, non-tender, non-distended, normal bowel sounds, no hepatosplenomegaly MSK: extremities are warm, no edema.  Skin: warm, no rash Neuro:  no focal deficits Psych: appropriate affect   Diagnostic Studies  02/2014 Echo Study Conclusions  - Procedure narrative: Transthoracic echocardiography for left ventricular function evaluation, for right ventricular function evaluation, and for assessment of valvular function.  Image quality was suboptimal, with poor valvular and endocardial visualization. Patient was tachycardic throughout exam. - Left ventricle: There was mild concentric hypertrophy. Systolic function was normal. The estimated ejection fraction was in the range of 50% to 55%. Images were inadequate for LV wall motion assessment. The study was not technically sufficient to allow evaluation of LV diastolic dysfunction due to atrial flutter. - Mitral valve: Mildly calcified annulus. Mildly thickened leaflets . There was trivial regurgitation. - Left atrium: The atrium was mildly dilated. - Tricuspid valve: There was trivial regurgitation.   Assessment and Plan   1. Aflutter/Afib - recent palpitations, seem to come on at intervals after taking his mediations. He is on both short acting metoprolol and dilt. Will try changing to long acting dilt 120mg  daily to see if more steady level of medication in his system will help with symptoms   F/u 4 weeks  Arnoldo Lenis, M.D.

## 2014-12-08 ENCOUNTER — Other Ambulatory Visit: Payer: Self-pay | Admitting: Cardiology

## 2015-02-09 ENCOUNTER — Other Ambulatory Visit: Payer: Self-pay

## 2015-02-09 MED ORDER — DILTIAZEM HCL ER COATED BEADS 120 MG PO CP24
120.0000 mg | ORAL_CAPSULE | Freq: Every day | ORAL | Status: DC
Start: 2015-02-09 — End: 2015-02-11

## 2015-02-09 MED ORDER — FUROSEMIDE 20 MG PO TABS: 20.0000 mg | ORAL_TABLET | Freq: Every day | ORAL | Status: DC

## 2015-02-09 MED ORDER — METOPROLOL TARTRATE 100 MG PO TABS: 100.0000 mg | ORAL_TABLET | Freq: Two times a day (BID) | ORAL | Status: DC

## 2015-02-11 ENCOUNTER — Other Ambulatory Visit: Payer: Self-pay

## 2015-02-11 MED ORDER — DILTIAZEM HCL ER COATED BEADS 120 MG PO CP24
120.0000 mg | ORAL_CAPSULE | Freq: Every day | ORAL | Status: DC
Start: 2015-02-11 — End: 2015-02-23

## 2015-02-11 MED ORDER — FUROSEMIDE 20 MG PO TABS: 20.0000 mg | ORAL_TABLET | Freq: Every day | ORAL | Status: DC

## 2015-02-11 MED ORDER — METOPROLOL TARTRATE 100 MG PO TABS: 100.0000 mg | ORAL_TABLET | Freq: Two times a day (BID) | ORAL | Status: DC

## 2015-02-11 NOTE — Telephone Encounter (Signed)
Refills complete 

## 2015-02-23 ENCOUNTER — Other Ambulatory Visit: Payer: Self-pay

## 2015-02-23 MED ORDER — METOPROLOL TARTRATE 100 MG PO TABS: 100.0000 mg | ORAL_TABLET | Freq: Two times a day (BID) | ORAL | Status: DC

## 2015-02-23 MED ORDER — FUROSEMIDE 20 MG PO TABS: 20.0000 mg | ORAL_TABLET | Freq: Every day | ORAL | Status: DC

## 2015-02-23 MED ORDER — DILTIAZEM HCL ER COATED BEADS 120 MG PO CP24
120.0000 mg | ORAL_CAPSULE | Freq: Every day | ORAL | Status: DC
Start: 2015-02-23 — End: 2016-02-09

## 2015-03-31 ENCOUNTER — Encounter: Payer: Self-pay | Admitting: Cardiology

## 2015-03-31 ENCOUNTER — Ambulatory Visit (INDEPENDENT_AMBULATORY_CARE_PROVIDER_SITE_OTHER): Payer: Commercial Managed Care - HMO | Admitting: Cardiology

## 2015-03-31 VITALS — BP 126/88 | HR 65 | Ht 68.0 in | Wt 268.8 lb

## 2015-03-31 DIAGNOSIS — E785 Hyperlipidemia, unspecified: Secondary | ICD-10-CM | POA: Diagnosis not present

## 2015-03-31 DIAGNOSIS — I4891 Unspecified atrial fibrillation: Secondary | ICD-10-CM

## 2015-03-31 NOTE — Patient Instructions (Signed)
Your physician wants you to follow-up in: 6 months with Dr Branch You will receive a reminder letter in the mail two months in advance. If you don't receive a letter, please call our office to schedule the follow-up appointment.     Your physician recommends that you continue on your current medications as directed. Please refer to the Current Medication list given to you today.     If you need a refill on your cardiac medications before your next appointment, please call your pharmacy.     Thank you for choosing [facility] !        

## 2015-03-31 NOTE — Progress Notes (Signed)
Patient ID: Arthur Le, male   DOB: April 20, 1950, 65 y.o.   MRN: [id number]     Clinical Summary Arthur Le is a 65 y.o.male seen today for follow up of the following medical problems.   1. Aflutter/Afib - palpitations improved since last visit. No significant recent symptoms - CHADS2Vasc score 1 , he has been on ASA only.   2. Hyperlipidemia - Jan 2016 TC 172 TG 104 HDL 37 LDL 114 - compliant with statin  3. OSA screen - does not snore, no apneic episodes. No day time somnolence.    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 Atrial flutter ([place])   . Hyperlipidemia      Allergies  Allergen Reactions  . Cefuroxime Axetil Nausea And Vomiting    Upset stomach     Current Outpatient Prescriptions  Medication Sig Dispense Refill  . aspirin 81 MG tablet Take 81 mg by mouth daily.    Marland Kitchen atorvastatin (LIPITOR) 40 MG tablet Take 40 mg by mouth daily.    . Cholecalciferol (VITAMIN D-3 PO) Take 5,000 Int'l Units by mouth.    . Coenzyme Q10 (COQ-10) 400 MG CAPS Take 1 capsule by mouth daily.    Marland Kitchen diltiazem (CARDIZEM CD) 120 MG 24 hr capsule Take 1 capsule (120 mg total) by mouth daily. 90 capsule 3  . furosemide (LASIX) 20 MG tablet Take 1 tablet (20 mg total) by mouth daily. 90 tablet 3  . Lecithin 1200 MG CAPS Take 1,200 mg by mouth daily.    Marland Kitchen loratadine (CLARITIN) 10 MG tablet Take 10 mg by mouth daily.    . metoprolol (LOPRESSOR) 100 MG tablet Take 1 tablet (100 mg total) by mouth 2 (two) times daily. 180 tablet 3  . Misc Natural Products (GINKOGIN PO) Take 240 mg by mouth daily.    . Omega-3 Fatty Acids (FISH OIL) 1000 MG CAPS Take 1,000 mg by mouth daily.    Marland Kitchen omeprazole (PRILOSEC) 20 MG capsule Take 20 mg by mouth daily.    . vitamin C (ASCORBIC ACID) 500 MG tablet Take 500 mg by mouth daily.    . Vitamin E (VITA-PLUS E PO) Take 800 mg by mouth.    . Zinc 50 MG TABS Take 50 mg by mouth daily.     No  current facility-administered medications for this visit.     No past surgical history on file.   Allergies  Allergen Reactions  . Cefuroxime Axetil Nausea And Vomiting    Upset stomach      Family history Denies any significant family history of heart disease   Social History Arthur Le reports that he has never smoked. He has never used smokeless tobacco. Arthur Le has no alcohol history on file.   Review of Systems CONSTITUTIONAL: No weight loss, fever, chills, weakness or fatigue.  HEENT: Eyes: No visual loss, blurred vision, double vision or yellow sclerae.No hearing loss, sneezing, congestion, runny nose or sore throat.  SKIN: No rash or itching.  CARDIOVASCULAR: no chest pain, no palpitations RESPIRATORY: No shortness of breath, cough or sputum.  GASTROINTESTINAL: No anorexia, nausea, vomiting or diarrhea. No abdominal pain or blood.  GENITOURINARY: No burning on urination, no polyuria NEUROLOGICAL: No headache, dizziness, syncope, paralysis, ataxia, numbness or tingling in the extremities. No change in bowel or bladder control.  MUSCULOSKELETAL: No muscle, back pain, joint pain or stiffness.  LYMPHATICS: No enlarged nodes. No history of splenectomy.  PSYCHIATRIC: No history of depression or anxiety.  ENDOCRINOLOGIC: No reports of sweating, cold or heat intolerance. No polyuria or polydipsia.  Marland Kitchen   Physical Examination Filed Vitals:   03/31/15 1004  BP: 126/88  Pulse: 65   Filed Vitals:   03/31/15 1004  Height: 5\' 8"  (1.727 m)  Weight: 268 lb 12.8 oz (121.927 kg)    Gen: resting comfortably, no acute distress HEENT: no scleral icterus, pupils equal round and reactive, no palptable cervical adenopathy,  CV: RRR, no m/rg/, no jvd Resp: Clear to auscultation bilaterally GI: abdomen is soft, non-tender, non-distended, normal bowel sounds, no hepatosplenomegaly MSK: extremities are warm, no edema.  Skin: warm, no rash Neuro:  no focal deficits Psych:  appropriate affect   Diagnostic Studies 02/2014 Echo Study Conclusions  - Procedure narrative: Transthoracic echocardiography for left ventricular function evaluation, for right ventricular function evaluation, and for assessment of valvular function. Image quality was suboptimal, with poor valvular and endocardial visualization. Patient was tachycardic throughout exam. - Left ventricle: There was mild concentric hypertrophy. Systolic function was normal. The estimated ejection fraction was in the range of 50% to 55%. Images were inadequate for LV wall motion assessment. The study was not technically sufficient to allow evaluation of LV diastolic dysfunction due to atrial flutter. - Mitral valve: Mildly calcified annulus. Mildly thickened leaflets . There was trivial regurgitation. - Left atrium: The atrium was mildly dilated. - Tricuspid valve: There was trivial regurgitation.    Assessment and Plan   1. Aflutter/Afib - no significant recent symptoms, we will continue current meds  2. Hyperlipidemia - reasonable control, continue statin       Arnoldo Lenis, M.D.

## 2015-04-01 DIAGNOSIS — I482 Chronic atrial fibrillation: Secondary | ICD-10-CM | POA: Diagnosis not present

## 2015-04-01 DIAGNOSIS — I10 Essential (primary) hypertension: Secondary | ICD-10-CM | POA: Diagnosis not present

## 2015-04-01 DIAGNOSIS — L989 Disorder of the skin and subcutaneous tissue, unspecified: Secondary | ICD-10-CM | POA: Diagnosis not present

## 2015-04-01 DIAGNOSIS — E669 Obesity, unspecified: Secondary | ICD-10-CM | POA: Diagnosis not present

## 2015-04-11 DIAGNOSIS — R208 Other disturbances of skin sensation: Secondary | ICD-10-CM | POA: Diagnosis not present

## 2015-04-11 DIAGNOSIS — X32XXXD Exposure to sunlight, subsequent encounter: Secondary | ICD-10-CM | POA: Diagnosis not present

## 2015-04-11 DIAGNOSIS — L570 Actinic keratosis: Secondary | ICD-10-CM | POA: Diagnosis not present

## 2015-04-11 DIAGNOSIS — L0212 Furuncle of neck: Secondary | ICD-10-CM | POA: Diagnosis not present

## 2015-04-11 DIAGNOSIS — L820 Inflamed seborrheic keratosis: Secondary | ICD-10-CM | POA: Diagnosis not present

## 2015-04-11 DIAGNOSIS — B9689 Other specified bacterial agents as the cause of diseases classified elsewhere: Secondary | ICD-10-CM | POA: Diagnosis not present

## 2015-04-19 DIAGNOSIS — H5210 Myopia, unspecified eye: Secondary | ICD-10-CM | POA: Diagnosis not present

## 2015-04-19 DIAGNOSIS — H52203 Unspecified astigmatism, bilateral: Secondary | ICD-10-CM | POA: Diagnosis not present

## 2015-04-20 DIAGNOSIS — Z0100 Encounter for examination of eyes and vision without abnormal findings: Secondary | ICD-10-CM | POA: Diagnosis not present

## 2015-07-12 DIAGNOSIS — Z0000 Encounter for general adult medical examination without abnormal findings: Secondary | ICD-10-CM | POA: Diagnosis not present

## 2015-07-12 DIAGNOSIS — Z1211 Encounter for screening for malignant neoplasm of colon: Secondary | ICD-10-CM | POA: Diagnosis not present

## 2015-07-13 DIAGNOSIS — E785 Hyperlipidemia, unspecified: Secondary | ICD-10-CM | POA: Diagnosis not present

## 2015-07-13 DIAGNOSIS — Z0000 Encounter for general adult medical examination without abnormal findings: Secondary | ICD-10-CM | POA: Diagnosis not present

## 2015-07-13 DIAGNOSIS — I4892 Unspecified atrial flutter: Secondary | ICD-10-CM | POA: Diagnosis not present

## 2015-07-13 DIAGNOSIS — T7849XS Other allergy, sequela: Secondary | ICD-10-CM | POA: Diagnosis not present

## 2015-07-13 DIAGNOSIS — I10 Essential (primary) hypertension: Secondary | ICD-10-CM | POA: Diagnosis not present

## 2015-07-13 DIAGNOSIS — E669 Obesity, unspecified: Secondary | ICD-10-CM | POA: Diagnosis not present

## 2015-07-13 DIAGNOSIS — Z125 Encounter for screening for malignant neoplasm of prostate: Secondary | ICD-10-CM | POA: Diagnosis not present

## 2015-08-08 DIAGNOSIS — R739 Hyperglycemia, unspecified: Secondary | ICD-10-CM | POA: Diagnosis not present

## 2015-09-02 ENCOUNTER — Encounter: Payer: Self-pay | Admitting: Cardiology

## 2015-09-05 NOTE — Telephone Encounter (Signed)
Patient was trying to send this message to Dr. Luan Pulling office. Will forward.

## 2015-11-25 ENCOUNTER — Encounter: Payer: Self-pay | Admitting: Cardiology

## 2015-11-25 ENCOUNTER — Encounter: Payer: Self-pay | Admitting: *Deleted

## 2015-11-25 ENCOUNTER — Ambulatory Visit (INDEPENDENT_AMBULATORY_CARE_PROVIDER_SITE_OTHER): Payer: Commercial Managed Care - HMO | Admitting: Cardiology

## 2015-11-25 VITALS — BP 112/90 | HR 94 | Ht 68.0 in | Wt 252.0 lb

## 2015-11-25 DIAGNOSIS — I4892 Unspecified atrial flutter: Secondary | ICD-10-CM

## 2015-11-25 DIAGNOSIS — E782 Mixed hyperlipidemia: Secondary | ICD-10-CM

## 2015-11-25 MED ORDER — APIXABAN 5 MG PO TABS: 5.0000 mg | ORAL_TABLET | Freq: Two times a day (BID) | ORAL | 0 refills | Status: DC

## 2015-11-25 MED ORDER — APIXABAN 5 MG PO TABS: 5.0000 mg | ORAL_TABLET | Freq: Two times a day (BID) | ORAL | 3 refills | Status: DC

## 2015-11-25 NOTE — Patient Instructions (Signed)
Your physician wants you to follow-up in: [place] DR. BRANCH  You will receive a reminder letter in the mail two months in advance. If you don't receive a letter, please call our office to schedule the follow-up appointment.  Your physician has recommended you make the following change in your medication:   STOP ASPIRIN   START ELIQUIS 5 MG TWICE DAILY  Thank you for choosing [facility]!!

## 2015-11-25 NOTE — Progress Notes (Signed)
Clinical Summary Arthur Le is a 65 y.o.male  seen today for follow up of the following medical problems.   1. Aflutter/Afib - palpitations improved since last visit. No significant recent symptoms - CHADS2Vasc score 1 , he has been on ASA only.   - just occasional palpitations, overall mild.   2. Hyperlipidemia - Jan 2016 TC 172 TG 104 HDL 37 LDL 114 - compliant with statin   3. OSA screen - does not snore, no apneic episodes. No day time somnolence.   4. DM2 - new diagnosis since our last visit - HgbA1c of 8, started on metformin.     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 Atrial flutter ([place])   . Hyperlipidemia      Allergies  Allergen Reactions  . Cefuroxime Axetil Nausea And Vomiting    Upset stomach     Current Outpatient Prescriptions  Medication Sig Dispense Refill  . aspirin 81 MG tablet Take 81 mg by mouth daily.    Marland Kitchen atorvastatin (LIPITOR) 40 MG tablet Take 40 mg by mouth daily.    . Cholecalciferol (VITAMIN D-3 PO) Take 5,000 Int'l Units by mouth.    . Coenzyme Q10 (COQ-10) 400 MG CAPS Take 1 capsule by mouth daily.    Marland Kitchen diltiazem (CARDIZEM CD) 120 MG 24 hr capsule Take 1 capsule (120 mg total) by mouth daily. 90 capsule 3  . furosemide (LASIX) 20 MG tablet Take 1 tablet (20 mg total) by mouth daily. 90 tablet 3  . Lecithin 1200 MG CAPS Take 1,200 mg by mouth daily.    Marland Kitchen loratadine (CLARITIN) 10 MG tablet Take 10 mg by mouth daily.    . metoprolol (LOPRESSOR) 100 MG tablet Take 1 tablet (100 mg total) by mouth 2 (two) times daily. 180 tablet 3  . Misc Natural Products (GINKOGIN PO) Take 240 mg by mouth daily.    . Omega-3 Fatty Acids (FISH OIL) 1000 MG CAPS Take 1,000 mg by mouth daily.    Marland Kitchen omeprazole (PRILOSEC) 20 MG capsule Take 20 mg by mouth daily.    . vitamin C (ASCORBIC ACID) 500 MG tablet Take 500 mg by mouth daily.    . Vitamin E (VITA-PLUS E PO) Take 800 mg  by mouth.    . Zinc 50 MG TABS Take 50 mg by mouth daily.     No current facility-administered medications for this visit.      No past surgical history on file.   Allergies  Allergen Reactions  . Cefuroxime Axetil Nausea And Vomiting    Upset stomach      No family history on file.   Social History Arthur Le reports that he has never smoked. He has never used smokeless tobacco. Arthur Le has no alcohol history on file.   Review of Systems CONSTITUTIONAL: No weight loss, fever, chills, weakness or fatigue.  HEENT: Eyes: No visual loss, blurred vision, double vision or yellow sclerae.No hearing loss, sneezing, congestion, runny nose or sore throat.  SKIN: No rash or itching.  CARDIOVASCULAR: per hpi RESPIRATORY: No shortness of breath, cough or sputum.  GASTROINTESTINAL: No anorexia, nausea, vomiting or diarrhea. No abdominal pain or blood.  GENITOURINARY: No burning on urination, no polyuria NEUROLOGICAL: No headache, dizziness, syncope, paralysis, ataxia, numbness or tingling in the extremities. No change in bowel or bladder control.  MUSCULOSKELETAL: No muscle, back pain, joint pain or stiffness.  LYMPHATICS:  No enlarged nodes. No history of splenectomy.  PSYCHIATRIC: No history of depression or anxiety.  ENDOCRINOLOGIC: No reports of sweating, cold or heat intolerance. No polyuria or polydipsia.  Marland Kitchen   Physical Examination Vitals:   11/25/15 1140  BP: 112/90  Pulse: 94   Vitals:   11/25/15 1140  Weight: 252 lb (114.3 kg)  Height: 5\' 8"  (1.727 m)    Gen: resting comfortably, no acute distress HEENT: no scleral icterus, pupils equal round and reactive, no palptable cervical adenopathy,  CV: RRR, no m/r/g,no jvd Resp: Clear to auscultation bilaterally GI: abdomen is soft, non-tender, non-distended, normal bowel sounds, no hepatosplenomegaly MSK: extremities are warm, no edema.  Skin: warm, no rash Neuro:  no focal deficits Psych: appropriate  affect   Diagnostic Studies 02/2014 Echo Study Conclusions  - Procedure narrative: Transthoracic echocardiography for left ventricular function evaluation, for right ventricular function evaluation, and for assessment of valvular function. Image quality was suboptimal, with poor valvular and endocardial visualization. Patient was tachycardic throughout exam. - Left ventricle: There was mild concentric hypertrophy. Systolic function was normal. The estimated ejection fraction was in the range of 50% to 55%. Images were inadequate for LV wall motion assessment. The study was not technically sufficient to allow evaluation of LV diastolic dysfunction due to atrial flutter. - Mitral valve: Mildly calcified annulus. Mildly thickened leaflets . There was trivial regurgitation. - Left atrium: The atrium was mildly dilated. - Tricuspid valve: There was trivial regurgitation.     Assessment and Plan  1. Aflutter/Afib - no significant recent symptoms - with his new diagnosis of DM2, his CHADS2VASC score is now 2 - we discussed risks vs benefits of anticoag. We will start eliquis 5mg  bid - ekg in clinic today shows afib rate controlled  2. Hyperlipidemia - continue statin - request labs from pcp      Arnoldo Lenis, M.D.

## 2016-02-09 ENCOUNTER — Other Ambulatory Visit: Payer: Self-pay | Admitting: Cardiology

## 2016-02-17 ENCOUNTER — Telehealth: Payer: Self-pay | Admitting: Cardiology

## 2016-02-17 NOTE — Telephone Encounter (Signed)
Hold eliquis for 2 days prior to surgery, then resume one day after   J BrancH MD

## 2016-02-17 NOTE — Telephone Encounter (Signed)
Pt notified, verbalized understanding.

## 2016-02-17 NOTE — Telephone Encounter (Signed)
Got answering machine,lm-cc

## 2016-02-17 NOTE — Telephone Encounter (Signed)
Patient needs 2 teeth extracted and is on Eliquis. Needs to know what to do with that prior to extraction. / tg

## 2016-02-17 NOTE — Telephone Encounter (Signed)
Will forward to Dr. Branch. 

## 2016-04-07 ENCOUNTER — Other Ambulatory Visit: Payer: Self-pay | Admitting: Cardiology

## 2016-04-13 ENCOUNTER — Other Ambulatory Visit: Payer: Self-pay | Admitting: Cardiology

## 2016-04-24 DIAGNOSIS — H52203 Unspecified astigmatism, bilateral: Secondary | ICD-10-CM | POA: Diagnosis not present

## 2016-04-24 DIAGNOSIS — E119 Type 2 diabetes mellitus without complications: Secondary | ICD-10-CM | POA: Diagnosis not present

## 2016-04-24 DIAGNOSIS — H5203 Hypermetropia, bilateral: Secondary | ICD-10-CM | POA: Diagnosis not present

## 2016-04-24 DIAGNOSIS — H524 Presbyopia: Secondary | ICD-10-CM | POA: Diagnosis not present

## 2016-04-24 DIAGNOSIS — H2513 Age-related nuclear cataract, bilateral: Secondary | ICD-10-CM | POA: Insufficient documentation

## 2016-05-16 ENCOUNTER — Other Ambulatory Visit: Payer: Self-pay | Admitting: Cardiology

## 2016-05-27 ENCOUNTER — Other Ambulatory Visit: Payer: Self-pay | Admitting: Cardiology

## 2016-06-18 ENCOUNTER — Other Ambulatory Visit: Payer: Self-pay | Admitting: Cardiology

## 2016-06-27 ENCOUNTER — Other Ambulatory Visit: Payer: Self-pay | Admitting: Cardiology

## 2016-06-28 ENCOUNTER — Other Ambulatory Visit: Payer: Self-pay | Admitting: Cardiology

## 2016-09-24 ENCOUNTER — Other Ambulatory Visit: Payer: Self-pay | Admitting: Cardiology

## 2016-10-31 ENCOUNTER — Telehealth: Payer: Self-pay | Admitting: Cardiology

## 2016-10-31 MED ORDER — APIXABAN 5 MG PO TABS: 5.0000 mg | ORAL_TABLET | Freq: Two times a day (BID) | ORAL | 1 refills | Status: DC

## 2016-10-31 NOTE — Telephone Encounter (Signed)
Pt needs a new 30 day prescription.Sent new RX for 30 day supply to [facility].

## 2016-10-31 NOTE — Telephone Encounter (Signed)
Per pt--he's in the "gap" w/ his Eliquis

## 2016-11-05 ENCOUNTER — Encounter: Payer: Self-pay | Admitting: Cardiology

## 2016-11-05 ENCOUNTER — Ambulatory Visit (INDEPENDENT_AMBULATORY_CARE_PROVIDER_SITE_OTHER): Payer: Medicare HMO | Admitting: Cardiology

## 2016-11-05 ENCOUNTER — Other Ambulatory Visit: Payer: Self-pay | Admitting: Cardiology

## 2016-11-05 VITALS — BP 132/88 | HR 94 | Ht 68.0 in | Wt 249.2 lb

## 2016-11-05 DIAGNOSIS — E669 Obesity, unspecified: Secondary | ICD-10-CM | POA: Diagnosis not present

## 2016-11-05 DIAGNOSIS — I4891 Unspecified atrial fibrillation: Secondary | ICD-10-CM

## 2016-11-05 DIAGNOSIS — E782 Mixed hyperlipidemia: Secondary | ICD-10-CM | POA: Diagnosis not present

## 2016-11-05 DIAGNOSIS — E119 Type 2 diabetes mellitus without complications: Secondary | ICD-10-CM
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
## 2016-11-05 NOTE — Patient Instructions (Signed)
Your physician wants you to follow-up in: [place] will receive a reminder letter in the mail two months in advance. If you don't receive a letter, please call our office to schedule the follow-up appointment.  Your physician recommends that you continue on your current medications as directed. Please refer to the Current Medication list given to you today.  Your physician recommends that you return for lab work PLEASE FAST 6-8 [place] - TSH/CBC/LIPIDS/MG/VIT D/HGBA1C/BMP/PSA  Thank you for choosing [facility]!!

## 2016-11-07 ENCOUNTER — Other Ambulatory Visit: Payer: Self-pay | Admitting: Cardiology

## 2016-11-07 DIAGNOSIS — I4892 Unspecified atrial flutter: Secondary | ICD-10-CM | POA: Diagnosis not present

## 2016-11-07 DIAGNOSIS — E669 Obesity, unspecified: Secondary | ICD-10-CM | POA: Diagnosis not present

## 2016-11-07 DIAGNOSIS — E782 Mixed hyperlipidemia: Secondary | ICD-10-CM | POA: Diagnosis not present

## 2016-11-08 LAB — CBC
HCT: 39.6 % (ref 38.5–50.0)
Hemoglobin: 13.6 g/dL (ref 13.2–17.1)
MCH: 29.1 pg (ref 27.0–33.0)
MCHC: 34.3 g/dL (ref 32.0–36.0)
MCV: 84.8 fL (ref 80.0–100.0)
MPV: 10.6 fL (ref 7.5–12.5)
PLATELETS: 191 10*3/uL (ref 140–400)
RBC: 4.67 10*6/uL (ref 4.20–5.80)
RDW: 13 % (ref 11.0–15.0)
WBC: 6.8 10*3/uL (ref 3.8–10.8)

## 2016-11-08 LAB — BASIC METABOLIC PANEL WITH GFR
BUN: 20 mg/dL (ref 7–25)
CALCIUM: 8.9 mg/dL (ref 8.6–10.3)
CHLORIDE: 106 mmol/L (ref 98–110)
CO2: 27 mmol/L (ref 20–32)
Creat: 0.98 mg/dL (ref 0.70–1.25)
GFR, Est African American: 91 mL/min/{1.73_m2} (ref >=60)
GFR, Est Non African American: 79 mL/min/{1.73_m2} (ref >=60)
GLUCOSE: 134 mg/dL — AB (ref 65–99)
Potassium: 4.3 mmol/L (ref 3.5–5.3)
Sodium: 141 mmol/L (ref 135–146)

## 2016-11-08 LAB — MAGNESIUM: MAGNESIUM: 1.5 mg/dL (ref 1.5–2.5)

## 2016-11-08 LAB — LIPID PANEL
Cholesterol: 155 mg/dL (ref <200)
HDL: 34 mg/dL — AB (ref >40)
LDL Cholesterol (Calc): 101 mg/dL (calc) — ABNORMAL HIGH
NON-HDL CHOLESTEROL (CALC): 121 mg/dL (ref <130)
Total CHOL/HDL Ratio: 4.6 (calc) (ref <5.0)
Triglycerides: 107 mg/dL (ref <150)

## 2016-11-08 LAB — VITAMIN D 25 HYDROXY (VIT D DEFICIENCY, FRACTURES): VIT D 25 HYDROXY: 68 ng/mL (ref 30–100)

## 2016-11-08 LAB — PSA: PSA: 1.3 ng/mL (ref <=4.0)

## 2016-11-08 LAB — HEMOGLOBIN A1C
EAG (MMOL/L): 7.3 (calc)
Hgb A1c MFr Bld: 6.2 % of total Hgb — ABNORMAL HIGH (ref <5.7)
Mean Plasma Glucose: 131 (calc)

## 2016-11-08 LAB — TSH: TSH: 2.62 mIU/L (ref 0.40–4.50)

## 2016-11-13 ENCOUNTER — Telehealth: Payer: Self-pay | Admitting: *Deleted

## 2016-11-13 DIAGNOSIS — I4892 Unspecified atrial flutter: Secondary | ICD-10-CM

## 2016-11-13 MED ORDER — LISINOPRIL 2.5 MG PO TABS: 2.5000 mg | ORAL_TABLET | Freq: Every day | ORAL | 1 refills | Status: DC

## 2016-11-13 NOTE — Telephone Encounter (Signed)
-----   Message from Drema Dallas, [place] sent at 11/12/2016  1:43 PM EDT -----   ----- Message ----- From: Arnoldo Lenis, MD Sent: 11/12/2016  12:44 PM To: Drema Dallas, CMA  Cholesterol is a little high, please increase atorvastatin to 80mg  daily. Would also like to start lisinopril 2.5mg  daily, his bp was up a little last visit and given his diabetes this is also a good medicinet to help protect kidneys, Needs repeat bmet in 2 weeks after staring lisinopril  Carlyle Dolly MD

## 2016-11-13 NOTE — Telephone Encounter (Signed)
Pt declines to increase atorvastatin at this time but agreeable to start lisinopril - sent to [facility] as requested. Will mail pt lab orders.

## 2016-11-28 ENCOUNTER — Telehealth: Payer: Self-pay | Admitting: Cardiology

## 2016-11-28 NOTE — Telephone Encounter (Signed)
Please call pt concerning lab work 

## 2016-11-29 ENCOUNTER — Other Ambulatory Visit: Payer: Self-pay | Admitting: Cardiology

## 2016-11-29 DIAGNOSIS — I4892 Unspecified atrial flutter: Secondary | ICD-10-CM | POA: Diagnosis not present

## 2016-11-29 LAB — BASIC METABOLIC PANEL WITH GFR
BUN: 22 mg/dL (ref 7–25)
CO2: 26 mmol/L (ref 20–32)
CREATININE: 0.86 mg/dL (ref 0.70–1.25)
Calcium: 8.9 mg/dL (ref 8.6–10.3)
Chloride: 105 mmol/L (ref 98–110)
GFR, Est African American: 103 mL/min/{1.73_m2} (ref >=60)
GFR, Est Non African American: 89 mL/min/{1.73_m2} (ref >=60)
GLUCOSE: 127 mg/dL — AB (ref 65–99)
POTASSIUM: 4.1 mmol/L (ref 3.5–5.3)
SODIUM: 140 mmol/L (ref 135–146)

## 2016-11-29 NOTE — Telephone Encounter (Signed)
Will forward to [place].  

## 2016-12-03 NOTE — Telephone Encounter (Signed)
Pt actually had no questions regarding recent lab work. Says he had repeat labs done Thursday. Results in EPIC and pt aware that we will call with results once Dr Harl Bowie reads

## 2016-12-04 ENCOUNTER — Telehealth: Payer: Self-pay | Admitting: *Deleted

## 2016-12-04 NOTE — Telephone Encounter (Signed)
Pt aware - routed to pcp  

## 2016-12-04 NOTE — Telephone Encounter (Signed)
-----   Message from Arnoldo Lenis, MD sent at 12/03/2016 11:24 AM EDT ----- Labs look good  Zandra Abts MD

## 2016-12-24 DIAGNOSIS — M9901 Segmental and somatic dysfunction of cervical region: Secondary | ICD-10-CM | POA: Diagnosis not present

## 2016-12-24 DIAGNOSIS — M542 Cervicalgia: Secondary | ICD-10-CM | POA: Diagnosis not present

## 2016-12-24 DIAGNOSIS — M546 Pain in thoracic spine: Secondary | ICD-10-CM | POA: Diagnosis not present

## 2016-12-24 DIAGNOSIS — M9902 Segmental and somatic dysfunction of thoracic region: Secondary | ICD-10-CM | POA: Diagnosis not present

## 2016-12-26 DIAGNOSIS — M546 Pain in thoracic spine: Secondary | ICD-10-CM | POA: Diagnosis not present

## 2016-12-26 DIAGNOSIS — M9902 Segmental and somatic dysfunction of thoracic region: Secondary | ICD-10-CM | POA: Diagnosis not present

## 2016-12-26 DIAGNOSIS — M542 Cervicalgia: Secondary | ICD-10-CM | POA: Diagnosis not present

## 2016-12-26 DIAGNOSIS — M9901 Segmental and somatic dysfunction of cervical region: Secondary | ICD-10-CM | POA: Diagnosis not present

## 2016-12-31 DIAGNOSIS — M9901 Segmental and somatic dysfunction of cervical region: Secondary | ICD-10-CM | POA: Diagnosis not present

## 2016-12-31 DIAGNOSIS — M546 Pain in thoracic spine: Secondary | ICD-10-CM | POA: Diagnosis not present

## 2016-12-31 DIAGNOSIS — M9902 Segmental and somatic dysfunction of thoracic region: Secondary | ICD-10-CM | POA: Diagnosis not present

## 2016-12-31 DIAGNOSIS — M542 Cervicalgia: Secondary | ICD-10-CM | POA: Diagnosis not present

## 2017-01-02 ENCOUNTER — Telehealth: Payer: Self-pay | Admitting: Cardiology

## 2017-01-02 NOTE — Telephone Encounter (Signed)
Patient states that he thinks he may have blood clot in right bicep / tg
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
## 2017-01-03 DIAGNOSIS — I8011 Phlebitis and thrombophlebitis of right femoral vein: Secondary | ICD-10-CM | POA: Diagnosis not present

## 2017-01-04 DIAGNOSIS — M9902 Segmental and somatic dysfunction of thoracic region: Secondary | ICD-10-CM | POA: Diagnosis not present

## 2017-01-04 DIAGNOSIS — M9901 Segmental and somatic dysfunction of cervical region: Secondary | ICD-10-CM | POA: Diagnosis not present

## 2017-01-04 DIAGNOSIS — M542 Cervicalgia: Secondary | ICD-10-CM | POA: Diagnosis not present

## 2017-01-04 DIAGNOSIS — M546 Pain in thoracic spine: Secondary | ICD-10-CM | POA: Diagnosis not present

## 2017-02-06 ENCOUNTER — Other Ambulatory Visit: Payer: Self-pay | Admitting: Cardiology

## 2017-02-11 ENCOUNTER — Telehealth: Payer: Self-pay | Admitting: Cardiology

## 2017-02-11 NOTE — Telephone Encounter (Signed)
Samples, 3 boxes, Eliquis 5 mg, lot # [id number], exp 3/ 2021

## 2017-02-11 NOTE — Telephone Encounter (Signed)
Would like to know if he can get some samples of his ELIQUIS 5 MG TABS tablet [[id number]]  Till he receives his Rx from [facility] mail order

## 2017-02-12 ENCOUNTER — Encounter: Payer: Self-pay | Admitting: Cardiology

## 2017-02-12 ENCOUNTER — Ambulatory Visit: Payer: Medicare HMO | Admitting: Cardiology

## 2017-02-12 VITALS — BP 118/86 | HR 99 | Ht 68.0 in | Wt 248.0 lb

## 2017-02-12 DIAGNOSIS — M7989 Other specified soft tissue disorders: Secondary | ICD-10-CM | POA: Diagnosis not present

## 2017-02-12 MED ORDER — LOSARTAN POTASSIUM 25 MG PO TABS: 12.5000 mg | ORAL_TABLET | Freq: Every day | ORAL | 3 refills | Status: DC

## 2017-02-12 NOTE — Progress Notes (Signed)
Clinical Summary Arthur Le is a 67 y.o.male seen today for follow up of the following medical problems. This is a focused visit on recent arm swelling and pain.    1. Right arm swelling - noticed swelling of vein in right bicep and surround area in mid December. Is unaware of any specific injury. Can be painful at times.   stable in size over the few weeks.  - no significant injury.       Past Medical History:  Diagnosis Date  . Atrial flutter ([place])   . Hyperlipidemia      Allergies  Allergen Reactions  . Doxycycline   . Cefuroxime Axetil Nausea And Vomiting    Upset stomach     Current Outpatient Medications  Medication Sig Dispense Refill  . atorvastatin (LIPITOR) 40 MG tablet Take 40 mg by mouth daily.    Marland Kitchen CARTIA XT 120 MG 24 hr capsule TAKE 1 CAPSULE EVERY DAY (NEED MD APPOINTMENT) 90 capsule 0  . Cholecalciferol (VITAMIN D-3 PO) Take 5,000 Int'l Units by mouth.    . Coenzyme Q10 (COQ-10) 400 MG CAPS Take 1 capsule by mouth daily.    Marland Kitchen ELIQUIS 5 MG TABS tablet TAKE 1 TABLET TWICE DAILY (STOP ASPIRIN) 180 tablet 3  . furosemide (LASIX) 20 MG tablet TAKE 1 TABLET (20 MG TOTAL) BY MOUTH DAILY. 90 tablet 3  . Lecithin 1200 MG CAPS Take 1,200 mg by mouth daily.    Marland Kitchen lisinopril (PRINIVIL,ZESTRIL) 2.5 MG tablet Take 1 tablet (2.5 mg total) by mouth daily. 90 tablet 1  . loratadine (CLARITIN) 10 MG tablet Take 10 mg by mouth daily.    . metFORMIN (GLUCOPHAGE) 500 MG tablet Take 500 mg by mouth 2 (two) times daily with a meal.    . metoprolol tartrate (LOPRESSOR) 100 MG tablet TAKE 1 TABLET TWICE DAILY (NEED OFFICE VISIT) 180 tablet 0  . Misc Natural Products (GINKOGIN PO) Take 240 mg by mouth daily.    . Omega-3 Fatty Acids (FISH OIL) 1000 MG CAPS Take 1,000 mg by mouth daily.    Marland Kitchen omeprazole (PRILOSEC) 20 MG capsule Take 20 mg by mouth daily.    . vitamin C (ASCORBIC ACID) 500 MG tablet Take 500 mg by mouth daily.    . Vitamin E (VITA-PLUS E PO) Take 800 mg by  mouth.    . Zinc 50 MG TABS Take 50 mg by mouth daily.     No current facility-administered medications for this visit.         Allergies  Allergen Reactions  . Doxycycline   . Cefuroxime Axetil Nausea And Vomiting    Upset stomach      Family History  Problem Relation Age of Onset  . Heart attack Mother   . Arrhythmia Father      Social History Arthur Le reports that  has never smoked. he has never used smokeless tobacco. Arthur Le has no alcohol history on file.   Review of Systems CONSTITUTIONAL: No weight loss, fever, chills, weakness or fatigue.  HEENT: Eyes: No visual loss, blurred vision, double vision or yellow sclerae.No hearing loss, sneezing, congestion, runny nose or sore throat.  SKIN: No rash or itching.  CARDIOVASCULAR: no chest pain, no palpitatoins.  RESPIRATORY: No shortness of breath, cough or sputum.  GASTROINTESTINAL: No anorexia, nausea, vomiting or diarrhea. No abdominal pain or blood.  GENITOURINARY: No burning on urination, no polyuria NEUROLOGICAL: No headache, dizziness, syncope, paralysis, ataxia, numbness or tingling in the extremities.  No change in bowel or bladder control.  MUSCULOSKELETAL: right arm swelling.  LYMPHATICS: No enlarged nodes. No history of splenectomy.  PSYCHIATRIC: No history of depression or anxiety.  ENDOCRINOLOGIC: No reports of sweating, cold or heat intolerance. No polyuria or polydipsia.  Marland Kitchen   Physical Examination Vitals:   02/12/17 1143  BP: 118/86  Pulse: 99  SpO2: 97%   Vitals:   02/12/17 1143  Weight: 248 lb (112.5 kg)  Height: 5\' 8"  (1.727 m)    Focal exam of right arm  Mild swelling right bicep. Promineny superficial vein with palpable linear chords.    Diagnostic Studies 02/2014 Echo Study Conclusions  - Procedure narrative: Transthoracic echocardiography for left ventricular function evaluation, for right ventricular function evaluation, and for assessment of valvular function.  Image quality was suboptimal, with poor valvular and endocardial visualization. Patient was tachycardic throughout exam. - Left ventricle: There was mild concentric hypertrophy. Systolic function was normal. The estimated ejection fraction was in the range of 50% to 55%. Images were inadequate for LV wall motion assessment. The study was not technically sufficient to allow evaluation of LV diastolic dysfunction due to atrial flutter. - Mitral valve: Mildly calcified annulus. Mildly thickened leaflets . There was trivial regurgitation. - Left atrium: The atrium was mildly dilated. - Tricuspid valve: There was trivial regurgitation.       Assessment and Plan   1. Right arm swelling/pain - presentation not consistent with DVT, he has also been on anticoag - suspect superficial thrombophlebitis vs superficial hematoma. We will obtain RUE venous US.      Arnoldo Lenis, M.D.

## 2017-02-12 NOTE — Patient Instructions (Signed)
Medication Instructions:  STOP LISIONPRIL  START LOSARTAN 12.5 MG DAILY   Labwork: NONE  Testing/Procedures: RIGHT UPPER EXTREMITY VENOUS ULTRASOUND   Follow-Up: Your physician recommends that you schedule a follow-up appointment in: October 2019    Any Other Special Instructions Will Be Listed Below (If Applicable).     If you need a refill on your cardiac medications before your next appointment, please call your pharmacy.

## 2017-02-15 ENCOUNTER — Encounter: Payer: Self-pay | Admitting: Cardiology

## 2017-02-15 ENCOUNTER — Ambulatory Visit (HOSPITAL_COMMUNITY)
Admission: RE | Admit: 2017-02-15 | Discharge: 2017-02-15 | Disposition: A | Discharge status: Home / Self Care | Payer: Medicare HMO | Source: Ambulatory Visit | Attending: Cardiology | Admitting: Cardiology

## 2017-02-15 DIAGNOSIS — M7989 Other specified soft tissue disorders: Secondary | ICD-10-CM | POA: Insufficient documentation

## 2017-02-15 DIAGNOSIS — E041 Nontoxic single thyroid nodule: Secondary | ICD-10-CM | POA: Insufficient documentation

## 2017-02-15 DIAGNOSIS — R600 Localized edema: Secondary | ICD-10-CM | POA: Diagnosis not present

## 2017-02-20 ENCOUNTER — Telehealth: Payer: Self-pay

## 2017-02-20 NOTE — Telephone Encounter (Signed)
-----   Message from Arnoldo Lenis, MD sent at 02/19/2017 12:19 PM EST ----- No significant abnormality of the arm noted in [place], I think this is likely thrombophlebitis. He can try using warm compresses and over time should resolved. Of note incidental findings of a thyroid nodule, this likely is benign buy may need further workup, please make pt aware he needs to discuss with his pcp  Zandra Abts MD

## 2017-02-20 NOTE — Telephone Encounter (Signed)
Called pt., no answer. Left message for pt to return call.  

## 2017-03-04 ENCOUNTER — Encounter: Payer: Self-pay | Admitting: Cardiology

## 2017-03-07 DIAGNOSIS — M9901 Segmental and somatic dysfunction of cervical region: Secondary | ICD-10-CM | POA: Diagnosis not present

## 2017-03-07 DIAGNOSIS — M9902 Segmental and somatic dysfunction of thoracic region: Secondary | ICD-10-CM | POA: Diagnosis not present

## 2017-03-07 DIAGNOSIS — M542 Cervicalgia: Secondary | ICD-10-CM | POA: Diagnosis not present

## 2017-03-07 DIAGNOSIS — M546 Pain in thoracic spine: Secondary | ICD-10-CM | POA: Diagnosis not present

## 2017-03-28 ENCOUNTER — Other Ambulatory Visit (HOSPITAL_COMMUNITY): Payer: Self-pay | Admitting: Pulmonary Disease

## 2017-03-28 DIAGNOSIS — E041 Nontoxic single thyroid nodule: Secondary | ICD-10-CM | POA: Diagnosis not present

## 2017-03-28 DIAGNOSIS — E669 Obesity, unspecified: Secondary | ICD-10-CM | POA: Diagnosis not present

## 2017-03-28 DIAGNOSIS — E785 Hyperlipidemia, unspecified: Secondary | ICD-10-CM | POA: Diagnosis not present

## 2017-03-28 DIAGNOSIS — I10 Essential (primary) hypertension: Secondary | ICD-10-CM | POA: Diagnosis not present

## 2017-04-02 ENCOUNTER — Ambulatory Visit (HOSPITAL_COMMUNITY)
Admission: RE | Admit: 2017-04-02 | Discharge: 2017-04-02 | Disposition: A | Discharge status: Home / Self Care | Payer: Medicare HMO | Source: Ambulatory Visit | Attending: Pulmonary Disease | Admitting: Pulmonary Disease

## 2017-04-02 DIAGNOSIS — E041 Nontoxic single thyroid nodule: Secondary | ICD-10-CM | POA: Diagnosis not present

## 2017-04-02 DIAGNOSIS — E042 Nontoxic multinodular goiter: Secondary | ICD-10-CM | POA: Diagnosis not present

## 2017-04-08 ENCOUNTER — Other Ambulatory Visit: Payer: Self-pay | Admitting: Cardiology

## 2017-04-18 ENCOUNTER — Other Ambulatory Visit (HOSPITAL_COMMUNITY): Payer: Self-pay | Admitting: Pulmonary Disease

## 2017-04-18 DIAGNOSIS — E041 Nontoxic single thyroid nodule: Secondary | ICD-10-CM

## 2017-04-19 ENCOUNTER — Telehealth: Payer: Self-pay | Admitting: Cardiology

## 2017-04-19 NOTE — Telephone Encounter (Signed)
Biopsy scheduled for 04/25/17

## 2017-04-19 NOTE — Telephone Encounter (Signed)
Pt is having a thyroid biopsy and needs to know when to hold eliquis

## 2017-04-22 ENCOUNTER — Other Ambulatory Visit: Payer: Self-pay

## 2017-04-22 MED ORDER — APIXABAN 5 MG PO TABS: 5.0000 mg | ORAL_TABLET | Freq: Two times a day (BID) | ORAL | 3 refills | Status: DC

## 2017-04-22 MED ORDER — DILTIAZEM HCL ER COATED BEADS 120 MG PO CP24: 120.0000 mg | ORAL_CAPSULE | Freq: Every day | ORAL | 3 refills | Status: DC

## 2017-04-22 NOTE — Telephone Encounter (Signed)
Attempt to reach, LMTCB-cc    I spoke to patient, he is aware that he should hold eliquis for 2 days before and resume the day after procedure

## 2017-04-22 NOTE — Telephone Encounter (Signed)
Hold 2 days prior, restart day after procedure   J BrancH MD

## 2017-04-24 ENCOUNTER — Ambulatory Visit (HOSPITAL_COMMUNITY): Payer: Medicare HMO

## 2017-04-25 ENCOUNTER — Encounter (HOSPITAL_COMMUNITY): Payer: Self-pay

## 2017-04-25 ENCOUNTER — Ambulatory Visit (HOSPITAL_COMMUNITY)
Admission: RE | Admit: 2017-04-25 | Discharge: 2017-04-25 | Disposition: A | Discharge status: Home / Self Care | Payer: Medicare HMO | Source: Ambulatory Visit | Attending: Pulmonary Disease | Admitting: Pulmonary Disease

## 2017-04-25 DIAGNOSIS — E041 Nontoxic single thyroid nodule: Secondary | ICD-10-CM | POA: Diagnosis not present

## 2017-04-25 DIAGNOSIS — E079 Disorder of thyroid, unspecified: Secondary | ICD-10-CM | POA: Diagnosis not present

## 2017-04-25 MED ORDER — LIDOCAINE HCL (PF) 2 % IJ SOLN
INTRAMUSCULAR | Status: AC
  Filled 2017-04-25: qty 10

## 2017-04-25 NOTE — Discharge Instructions (Signed)
Thyroid Biopsy °The thyroid gland is a butterfly-shaped gland located in the front of the neck. It produces hormones that affect metabolism, growth and development, and body temperature. Thyroid biopsy is a procedure in which small samples of tissue or fluid are removed from the thyroid gland. The samples are then looked at under a microscope to check for abnormalities. This procedure is done to determine the cause of thyroid problems. It may be done to check for infection, cancer, or other thyroid problems. °Two methods may be used for a thyroid biopsy. In one method, a thin needle is inserted through the skin and into the thyroid gland. In the other method, an open incision is made through the skin. °Tell a health care provider about: °· Any allergies you have. °· All medicines you are taking, including vitamins, herbs, eye drops, creams, and over-the-counter medicines. °· Any problems you or family members have had with anesthetic medicines. °· Any blood disorders you have. °· Any surgeries you have had. °· Any medical conditions you have. °What are the risks? °Generally, this is a safe procedure. However, problems can occur and include: °· Bleeding from the procedure site. °· Infection. °· Injury to structures near the thyroid gland. ° °What happens before the procedure? °· Ask your health care provider about: °? Changing or stopping your regular medicines. This is especially important if you are taking diabetes medicines or blood thinners. °? Taking medicines such as aspirin and ibuprofen. These medicines can thin your blood. Do not take these medicines before your procedure if your health care provider asks you not to. °· Do not eat or drink anything after midnight on the night before the procedure or as directed by your health care provider. °· You may have a blood sample taken. °What happens during the procedure? °Either of these methods may be used to perform a thyroid biopsy: °· Fine needle biopsy. You may  be given medicine to help you relax (sedative). You will be asked to lie on your back with your head tipped backward to extend your neck. An area on your neck will be cleaned. A needle will then be inserted through the skin of your neck. You may be asked to avoid coughing, talking, swallowing, or making sounds during some portions of the procedure. The needle will be withdrawn once the tissue or fluid samples have been removed. Pressure may be applied to your neck to reduce swelling and ensure that bleeding has stopped. The samples will be sent to a lab for examination. °· Open biopsy. You will be given medicine to make you sleep (general anesthetic). An incision will be made in your neck. A sample of thyroid tissue will be removed using surgical tools. The tissue sample will be sent for examination. In some cases, the sample may be examined during the biopsy. If that is done and cancer cells are found, some or all of the thyroid gland may be removed. The incision will be closed with stitches. ° °What happens after the procedure? °· Your recovery will be assessed and monitored. °· You may have soreness and tenderness at the site of the biopsy. This should go away after a few days. °· If you had an open biopsy, you may have a hoarse voice or sore throat for a couple days. °· It is your responsibility to get your test results. °This information is not intended to replace advice given to you by your health care provider. Make sure you discuss any questions you have with   your health care provider. °Document Released: 11/19/2006 Document Revised: 09/25/2015 Document Reviewed: 04/16/2013 °Elsevier Interactive Patient Education © 2018 [employer] ° °

## 2017-04-30 DIAGNOSIS — E041 Nontoxic single thyroid nodule: Secondary | ICD-10-CM | POA: Diagnosis not present

## 2017-05-03 DIAGNOSIS — H2513 Age-related nuclear cataract, bilateral: Secondary | ICD-10-CM | POA: Diagnosis not present

## 2017-05-03 DIAGNOSIS — E119 Type 2 diabetes mellitus without complications: Secondary | ICD-10-CM | POA: Diagnosis not present

## 2017-05-03 DIAGNOSIS — Z7984 Long term (current) use of oral hypoglycemic drugs: Secondary | ICD-10-CM | POA: Diagnosis not present

## 2017-05-03 DIAGNOSIS — H5203 Hypermetropia, bilateral: Secondary | ICD-10-CM | POA: Diagnosis not present

## 2017-05-03 DIAGNOSIS — H524 Presbyopia: Secondary | ICD-10-CM | POA: Diagnosis not present

## 2017-05-03 DIAGNOSIS — H52203 Unspecified astigmatism, bilateral: Secondary | ICD-10-CM | POA: Diagnosis not present

## 2017-05-10 ENCOUNTER — Encounter (HOSPITAL_COMMUNITY): Payer: Self-pay | Admitting: Radiology

## 2017-07-11 ENCOUNTER — Other Ambulatory Visit: Payer: Self-pay | Admitting: Cardiology

## 2017-08-02 DIAGNOSIS — M546 Pain in thoracic spine: Secondary | ICD-10-CM | POA: Diagnosis not present

## 2017-08-02 DIAGNOSIS — M9901 Segmental and somatic dysfunction of cervical region: Secondary | ICD-10-CM | POA: Diagnosis not present

## 2017-08-02 DIAGNOSIS — M545 Low back pain: Secondary | ICD-10-CM | POA: Diagnosis not present

## 2017-08-02 DIAGNOSIS — M542 Cervicalgia: Secondary | ICD-10-CM | POA: Diagnosis not present

## 2017-08-02 DIAGNOSIS — M9903 Segmental and somatic dysfunction of lumbar region: Secondary | ICD-10-CM | POA: Diagnosis not present

## 2017-08-02 DIAGNOSIS — M9902 Segmental and somatic dysfunction of thoracic region: Secondary | ICD-10-CM | POA: Diagnosis not present

## 2017-08-05 DIAGNOSIS — M9902 Segmental and somatic dysfunction of thoracic region: Secondary | ICD-10-CM | POA: Diagnosis not present

## 2017-08-05 DIAGNOSIS — M542 Cervicalgia: Secondary | ICD-10-CM | POA: Diagnosis not present

## 2017-08-05 DIAGNOSIS — M545 Low back pain: Secondary | ICD-10-CM | POA: Diagnosis not present

## 2017-08-05 DIAGNOSIS — M9903 Segmental and somatic dysfunction of lumbar region: Secondary | ICD-10-CM | POA: Diagnosis not present

## 2017-08-05 DIAGNOSIS — M546 Pain in thoracic spine: Secondary | ICD-10-CM | POA: Diagnosis not present

## 2017-08-05 DIAGNOSIS — M9901 Segmental and somatic dysfunction of cervical region: Secondary | ICD-10-CM | POA: Diagnosis not present

## 2017-09-02 DIAGNOSIS — M546 Pain in thoracic spine: Secondary | ICD-10-CM | POA: Diagnosis not present

## 2017-09-02 DIAGNOSIS — M545 Low back pain: Secondary | ICD-10-CM | POA: Diagnosis not present

## 2017-09-02 DIAGNOSIS — M9903 Segmental and somatic dysfunction of lumbar region: Secondary | ICD-10-CM | POA: Diagnosis not present

## 2017-09-02 DIAGNOSIS — M542 Cervicalgia: Secondary | ICD-10-CM | POA: Diagnosis not present

## 2017-09-02 DIAGNOSIS — M9901 Segmental and somatic dysfunction of cervical region: Secondary | ICD-10-CM | POA: Diagnosis not present

## 2017-09-02 DIAGNOSIS — M9902 Segmental and somatic dysfunction of thoracic region: Secondary | ICD-10-CM | POA: Diagnosis not present

## 2017-09-04 ENCOUNTER — Telehealth: Payer: Self-pay | Admitting: Cardiology

## 2017-09-04 MED ORDER — APIXABAN 5 MG PO TABS: 5.0000 mg | ORAL_TABLET | Freq: Two times a day (BID) | ORAL | 3 refills | Status: DC

## 2017-09-04 NOTE — Telephone Encounter (Signed)
Patient called back to confirm the deductible amount of $425. Aware

## 2017-09-04 NOTE — Telephone Encounter (Signed)
Patient advised to contact his insurance to see how much out of pocket expense he has made for the year. Patient said that he has paid $250 so far. Patient advised that once $425 deductible is met for the year that his refills would be $25/mth. Patient said he would contact his insurance to see what the remainder is so that he can start paying $25/mth for refills. Patient will contact our office if he has any problems getting his eliquis. Verbalized understanding.

## 2017-09-04 NOTE — Addendum Note (Signed)
Addended by: Merlene Laughter on: 09/04/2017 02:36 PM   Modules accepted: Orders

## 2017-09-04 NOTE — Telephone Encounter (Signed)
Please give pt a call he's in the doughnut hole for his apixaban (ELIQUIS) 5 MG TABS tablet [[id number]]  Would like some samples

## 2017-09-04 NOTE — Telephone Encounter (Addendum)
Per patient, [employer] doesn't participate in the discount for eliquis through [employer]. Patient request that rx be sent to local pharmacy [facility].

## 2017-09-05 ENCOUNTER — Telehealth: Payer: Self-pay | Admitting: Cardiology

## 2017-09-05 MED ORDER — APIXABAN 5 MG PO TABS: 5.0000 mg | ORAL_TABLET | Freq: Two times a day (BID) | ORAL | 0 refills | Status: DC

## 2017-09-05 NOTE — Telephone Encounter (Signed)
lvm requesting Alma Friendly to give him a call back concerning his Eliquis.

## 2017-09-05 NOTE — Telephone Encounter (Signed)
Spoke with patient and he stated that he was unable to get eliquis from [facility] that they were not aware of a program that paying $425/yr deductible leaves refills for $25/mth.   [facility] contacted to verify details about this patient and technician stated that patient owed $625 on deductible for eliquis prescription.   Samples provided for patient and Denice Bors [employer] representative contacted to get clarification on the deductible process.

## 2017-09-10 NOTE — Telephone Encounter (Signed)
Spoke with patient and advised him that per Denice Bors (eliquis rep), he needed to call 1-855-eliquis for options in discounts and assistance. Patient also advised that samples will be made available for him until assistance is completed. Verbalized understanding of plan.

## 2017-09-17 NOTE — Telephone Encounter (Signed)
Pt states he dropped of pt assistance form, Hannah placed on [employer] LPN, desk    I have samples at front desk  Eliquis 5 mg 4 boxes, lot [id number], exp 8/21

## 2017-09-17 NOTE — Telephone Encounter (Signed)
LMTCB, We have samples of Eliquis here that  We can give patient.

## 2017-09-20 NOTE — Telephone Encounter (Signed)
Pt assistance form faxed to [employer].

## 2017-11-01 ENCOUNTER — Telehealth: Payer: Self-pay | Admitting: Cardiology

## 2017-11-01 NOTE — Telephone Encounter (Signed)
Pt to bring a copy of current out of pocket expenses for 2019. He will go and buy a 90 day supply of eliquis to meet $760 requirement.

## 2017-11-01 NOTE — Telephone Encounter (Signed)
Per phone call from pt-- he has not heard anything back from his Eliquis assistance and he's down to having about 3 days worth of pills, would like some samples.

## 2017-11-01 NOTE — Telephone Encounter (Signed)
Arthur Le faxed to [employer] on 09/05/17, she will investigate. I will give pt eliquis 5 mg samples, 2 boxes with 14 tabs each, lot [id number], exp 12/2019

## 2017-11-08 ENCOUNTER — Ambulatory Visit: Payer: Medicare HMO | Admitting: Cardiology

## 2017-11-08 ENCOUNTER — Telehealth: Payer: Self-pay | Admitting: *Deleted

## 2017-11-08 NOTE — Telephone Encounter (Signed)
Pt notified that he has been approved for the Eliquis assistance program. Pt also notified that patient assistance Eliquis available for pick up.

## 2017-12-05 ENCOUNTER — Ambulatory Visit: Payer: Medicare HMO | Admitting: Cardiology

## 2017-12-05 ENCOUNTER — Encounter: Payer: Self-pay | Admitting: Cardiology

## 2017-12-05 VITALS — BP 122/88 | HR 87 | Ht 68.0 in | Wt 261.0 lb

## 2017-12-05 DIAGNOSIS — I4891 Unspecified atrial fibrillation: Secondary | ICD-10-CM

## 2017-12-05 DIAGNOSIS — E782 Mixed hyperlipidemia: Secondary | ICD-10-CM | POA: Diagnosis not present

## 2017-12-05 DIAGNOSIS — E119 Type 2 diabetes mellitus without complications: Secondary | ICD-10-CM | POA: Diagnosis not present

## 2017-12-05 DIAGNOSIS — I10 Essential (primary) hypertension: Secondary | ICD-10-CM | POA: Diagnosis not present

## 2017-12-05 NOTE — Patient Instructions (Signed)
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
## 2018-02-03 ENCOUNTER — Telehealth: Payer: Self-pay | Admitting: *Deleted

## 2018-02-03 NOTE — Telephone Encounter (Signed)
Called pt to notify him that his patient assistance Eliquis has arrived.

## 2018-02-22 ENCOUNTER — Other Ambulatory Visit: Payer: Self-pay | Admitting: Cardiology

## 2018-05-12 ENCOUNTER — Telehealth: Payer: Self-pay

## 2018-05-12 NOTE — Telephone Encounter (Signed)
[employer] Patient assistance program approved Free Eliquis from 05/12/18 thru 38/87/19  Application case number: [id number]

## 2018-05-14 ENCOUNTER — Other Ambulatory Visit: Payer: Self-pay | Admitting: Cardiology

## 2018-05-14 DIAGNOSIS — K219 Gastro-esophageal reflux disease without esophagitis: Secondary | ICD-10-CM | POA: Diagnosis not present

## 2018-05-14 DIAGNOSIS — I4892 Unspecified atrial flutter: Secondary | ICD-10-CM | POA: Diagnosis not present

## 2018-05-14 DIAGNOSIS — E1169 Type 2 diabetes mellitus with other specified complication: Secondary | ICD-10-CM | POA: Diagnosis not present

## 2018-05-14 DIAGNOSIS — I10 Essential (primary) hypertension: Secondary | ICD-10-CM | POA: Diagnosis not present

## 2018-05-14 DIAGNOSIS — E782 Mixed hyperlipidemia: Secondary | ICD-10-CM | POA: Diagnosis not present

## 2018-05-16 ENCOUNTER — Telehealth: Payer: Self-pay

## 2018-05-16 NOTE — Telephone Encounter (Signed)
Left message for patient that his Eliquis has arrived from the [employer] Patient Assistance Program and to come to the [place] office to pick up

## 2018-07-01 DIAGNOSIS — H52203 Unspecified astigmatism, bilateral: Secondary | ICD-10-CM | POA: Diagnosis not present

## 2018-07-01 DIAGNOSIS — H5203 Hypermetropia, bilateral: Secondary | ICD-10-CM | POA: Diagnosis not present

## 2018-07-01 DIAGNOSIS — Z7984 Long term (current) use of oral hypoglycemic drugs: Secondary | ICD-10-CM | POA: Diagnosis not present

## 2018-07-01 DIAGNOSIS — H25813 Combined forms of age-related cataract, bilateral: Secondary | ICD-10-CM | POA: Diagnosis not present

## 2018-07-01 DIAGNOSIS — E119 Type 2 diabetes mellitus without complications: Secondary | ICD-10-CM | POA: Diagnosis not present

## 2018-07-01 DIAGNOSIS — H524 Presbyopia: Secondary | ICD-10-CM | POA: Diagnosis not present

## 2018-07-14 DIAGNOSIS — M542 Cervicalgia: Secondary | ICD-10-CM | POA: Diagnosis not present

## 2018-07-14 DIAGNOSIS — M9901 Segmental and somatic dysfunction of cervical region: Secondary | ICD-10-CM | POA: Diagnosis not present

## 2018-07-14 DIAGNOSIS — M9902 Segmental and somatic dysfunction of thoracic region: Secondary | ICD-10-CM | POA: Diagnosis not present

## 2018-07-14 DIAGNOSIS — M545 Low back pain: Secondary | ICD-10-CM | POA: Diagnosis not present

## 2018-07-14 DIAGNOSIS — M546 Pain in thoracic spine: Secondary | ICD-10-CM | POA: Diagnosis not present

## 2018-07-14 DIAGNOSIS — M9903 Segmental and somatic dysfunction of lumbar region: Secondary | ICD-10-CM | POA: Diagnosis not present

## 2018-07-16 DIAGNOSIS — L989 Disorder of the skin and subcutaneous tissue, unspecified: Secondary | ICD-10-CM | POA: Diagnosis not present

## 2018-07-18 DIAGNOSIS — M9901 Segmental and somatic dysfunction of cervical region: Secondary | ICD-10-CM | POA: Diagnosis not present

## 2018-07-18 DIAGNOSIS — M9903 Segmental and somatic dysfunction of lumbar region: Secondary | ICD-10-CM | POA: Diagnosis not present

## 2018-07-18 DIAGNOSIS — M542 Cervicalgia: Secondary | ICD-10-CM | POA: Diagnosis not present

## 2018-07-18 DIAGNOSIS — M546 Pain in thoracic spine: Secondary | ICD-10-CM | POA: Diagnosis not present

## 2018-07-18 DIAGNOSIS — M545 Low back pain: Secondary | ICD-10-CM | POA: Diagnosis not present

## 2018-07-18 DIAGNOSIS — M9902 Segmental and somatic dysfunction of thoracic region: Secondary | ICD-10-CM | POA: Diagnosis not present

## 2018-07-28 DIAGNOSIS — X32XXXA Exposure to sunlight, initial encounter: Secondary | ICD-10-CM | POA: Diagnosis not present

## 2018-07-28 DIAGNOSIS — C44319 Basal cell carcinoma of skin of other parts of face: Secondary | ICD-10-CM | POA: Diagnosis not present

## 2018-07-28 DIAGNOSIS — L570 Actinic keratosis: Secondary | ICD-10-CM | POA: Diagnosis not present

## 2018-07-28 DIAGNOSIS — D225 Melanocytic nevi of trunk: Secondary | ICD-10-CM | POA: Diagnosis not present

## 2018-07-28 DIAGNOSIS — L821 Other seborrheic keratosis: Secondary | ICD-10-CM | POA: Diagnosis not present

## 2018-07-28 DIAGNOSIS — C4441 Basal cell carcinoma of skin of scalp and neck: Secondary | ICD-10-CM | POA: Diagnosis not present

## 2018-08-11 DIAGNOSIS — Z1329 Encounter for screening for other suspected endocrine disorder: Secondary | ICD-10-CM | POA: Diagnosis not present

## 2018-08-11 DIAGNOSIS — Z1321 Encounter for screening for nutritional disorder: Secondary | ICD-10-CM | POA: Diagnosis not present

## 2018-08-11 DIAGNOSIS — I10 Essential (primary) hypertension: Secondary | ICD-10-CM | POA: Diagnosis not present

## 2018-08-11 DIAGNOSIS — E1169 Type 2 diabetes mellitus with other specified complication: Secondary | ICD-10-CM | POA: Diagnosis not present

## 2018-08-11 DIAGNOSIS — E782 Mixed hyperlipidemia: Secondary | ICD-10-CM | POA: Diagnosis not present

## 2018-08-11 DIAGNOSIS — E559 Vitamin D deficiency, unspecified: Secondary | ICD-10-CM | POA: Diagnosis not present

## 2018-08-18 DIAGNOSIS — I10 Essential (primary) hypertension: Secondary | ICD-10-CM | POA: Diagnosis not present

## 2018-08-18 DIAGNOSIS — E1169 Type 2 diabetes mellitus with other specified complication: Secondary | ICD-10-CM | POA: Diagnosis not present

## 2018-08-18 DIAGNOSIS — E782 Mixed hyperlipidemia: Secondary | ICD-10-CM | POA: Diagnosis not present

## 2018-08-18 DIAGNOSIS — K219 Gastro-esophageal reflux disease without esophagitis: Secondary | ICD-10-CM | POA: Diagnosis not present

## 2018-08-18 DIAGNOSIS — I4892 Unspecified atrial flutter: Secondary | ICD-10-CM | POA: Diagnosis not present

## 2018-08-18 DIAGNOSIS — E041 Nontoxic single thyroid nodule: Secondary | ICD-10-CM | POA: Diagnosis not present

## 2018-08-19 ENCOUNTER — Other Ambulatory Visit: Payer: Self-pay | Admitting: Cardiology

## 2018-09-03 DIAGNOSIS — K429 Umbilical hernia without obstruction or gangrene: Secondary | ICD-10-CM | POA: Diagnosis not present

## 2018-09-08 DIAGNOSIS — Z08 Encounter for follow-up examination after completed treatment for malignant neoplasm: Secondary | ICD-10-CM | POA: Diagnosis not present

## 2018-09-08 DIAGNOSIS — Z85828 Personal history of other malignant neoplasm of skin: Secondary | ICD-10-CM | POA: Diagnosis not present

## 2018-09-25 ENCOUNTER — Encounter: Payer: Self-pay | Admitting: General Surgery

## 2018-09-25 ENCOUNTER — Ambulatory Visit: Payer: Medicare HMO | Admitting: General Surgery

## 2018-09-25 ENCOUNTER — Other Ambulatory Visit: Payer: Self-pay

## 2018-09-25 VITALS — BP 144/97 | HR 83 | Temp 97.5°F | Resp 16 | Ht 68.0 in | Wt 244.0 lb

## 2018-09-25 DIAGNOSIS — K429 Umbilical hernia without obstruction or gangrene: Secondary | ICD-10-CM

## 2018-09-25 NOTE — Patient Instructions (Signed)
Ventral Hernia  A ventral hernia is a bulge of tissue from inside the abdomen that pushes through a weak area of the muscles that form the front wall of the abdomen. The tissues inside the abdomen are inside a sac (peritoneum). These tissues include the small intestine, large intestine, and the fatty tissue that covers the intestines (omentum). Sometimes, the bulge that forms a hernia contains intestines. Other hernias contain only fat. Ventral hernias do not go away without surgical treatment. There are several types of ventral hernias. You may have:  A hernia at an incision site from previous abdominal surgery (incisional hernia).  A hernia just above the belly button (epigastric hernia), or at the belly button (umbilical hernia). These types of hernias can develop from heavy lifting or straining.  A hernia that comes and goes (reducible hernia). It may be visible only when you lift or strain. This type of hernia can be pushed back into the abdomen (reduced).  A hernia that traps abdominal tissue inside the hernia (incarcerated hernia). This type of hernia does not reduce.  A hernia that cuts off blood flow to the tissues inside the hernia (strangulated hernia). The tissues can start to die if this happens. This is a very painful bulge that cannot be reduced. A strangulated hernia is a medical emergency. What are the causes? This condition is caused by abdominal tissue putting pressure on an area of weakness in the abdominal muscles. What increases the risk? The following factors may make you more likely to develop this condition:  Being male.  Being 60 or older.  Being overweight or obese.  Having had previous abdominal surgery, especially if there was an infection after surgery.  Having had an injury to the abdominal wall.  Having had several pregnancies.  Having a buildup of fluid inside the abdomen (ascites). What are the signs or symptoms? The only symptom of a ventral hernia  may be a painless bulge in the abdomen. A reducible hernia may be visible only when you strain, cough, or lift. Other symptoms may include:  Dull pain.  A feeling of pressure. Signs and symptoms of a strangulated hernia may include:  Increasing pain.  Nausea and vomiting.  Pain when pressing on the hernia.  The skin over the hernia turning red or purple.  Constipation.  Blood in the stool (feces). How is this diagnosed? This condition may be diagnosed based on:  Your symptoms.  Your medical history.  A physical exam. You may be asked to cough or strain while standing. These actions increase the pressure inside your abdomen and force the hernia through the opening in your muscles. Your health care provider may try to reduce the hernia by pressing on it.  Imaging studies, such as an ultrasound or CT scan. How is this treated? This condition is treated with surgery. If you have a strangulated hernia, surgery is done as soon as possible. If your hernia is small and not incarcerated, you may be asked to lose some weight before surgery. Follow these instructions at home:  Follow instructions from your health care provider about eating or drinking restrictions.  If you are overweight, your health care provider may recommend that you increase your activity level and eat a healthier diet.  Do not lift anything that is heavier than 10 lb (4.5 kg).  Return to your normal activities as told by your health care provider. Ask your health care provider what activities are safe for you. You may need to avoid activities   that increase pressure on your hernia.  Take over-the-counter and prescription medicines only as told by your health care provider.  Keep all follow-up visits as told by your health care provider. This is important. Contact a health care provider if:  Your hernia gets larger.  Your hernia becomes painful. Get help right away if:  Your hernia becomes increasingly  painful.  You have pain along with any of the following: ? Changes in skin color in the area of the hernia. ? Nausea. ? Vomiting. ? Fever. Summary  A ventral hernia is a bulge of tissue from inside the abdomen that pushes through a weak area of the muscles that form the front wall of the abdomen.  This condition is treated with surgery, which may be urgent depending on your hernia.  Do not lift anything that is heavier than 10 lb (4.5 kg), and follow activity instructions from your health care provider. This information is not intended to replace advice given to you by your health care provider. Make sure you discuss any questions you have with your health care provider. Document Released: 01/09/2012 Document Revised: 03/06/2017 Document Reviewed: 08/13/2016 Elsevier Patient Education  2020 [employer].

## 2018-09-25 NOTE — Progress Notes (Signed)
Arthur Le; [id number]; 1950/05/11   HPI Patient is a 68 year old white male who was referred to my care by Delphina Cahill for evaluation treatment of an umbilical hernia.  Patient states he has had the umbilical hernia for approximately 20 years.  He did have an episode 1 month ago where he had some periumbilical pain after wearing a tight belt.  At that time, the hernia was reduced by the belt.  He denies any nausea or vomiting.  He has not had any symptoms since that time.  He has 0 out of 10 abdominal pain.  He is on Eliquis for atrial fibrillation.  When he has had dental work, he has not been switched to Lovenox injection for bridging. Past Medical History:  Diagnosis Date  . Atrial flutter ([place])   . Hyperlipidemia     Past Surgical History:  Procedure Laterality Date  . TONSILLECTOMY AND ADENOIDECTOMY      Family History  Problem Relation Age of Onset  . Heart attack Mother   . Arrhythmia Father     Current Outpatient Medications on File Prior to Visit  Medication Sig Dispense Refill  . apixaban (ELIQUIS) 5 MG TABS tablet Take 1 tablet (5 mg total) by mouth 2 (two) times daily. 56 tablet 0  . atorvastatin (LIPITOR) 40 MG tablet Take 40 mg by mouth daily.    . Cholecalciferol (VITAMIN D-3 PO) Take 5,000 Int'l Units by mouth.    . Coenzyme Q10 (COQ-10) 400 MG CAPS Take 1 capsule by mouth daily.    Marland Kitchen diltiazem (CARDIZEM CD) 120 MG 24 hr capsule TAKE 1 CAPSULE EVERY DAY 90 capsule 3  . furosemide (LASIX) 20 MG tablet TAKE 1 TABLET (20 MG TOTAL) BY MOUTH DAILY. 90 tablet 3  . Lecithin 1200 MG CAPS Take 1,200 mg by mouth daily.    Marland Kitchen loratadine (CLARITIN) 10 MG tablet Take 10 mg by mouth daily.    Marland Kitchen losartan (COZAAR) 25 MG tablet TAKE 1/2 TABLET EVERY DAY 45 tablet 3  . metFORMIN (GLUCOPHAGE) 500 MG tablet Take 500 mg by mouth 2 (two) times daily with a meal.    . metoprolol tartrate (LOPRESSOR) 100 MG tablet TAKE 1 TABLET TWICE DAILY 180 tablet 3  . Misc Natural Products (GINKOGIN  PO) Take 240 mg by mouth daily.    . Omega-3 Fatty Acids (FISH OIL) 1000 MG CAPS Take 1,000 mg by mouth daily.    Marland Kitchen omeprazole (PRILOSEC) 20 MG capsule Take 20 mg by mouth daily.    . vitamin C (ASCORBIC ACID) 500 MG tablet Take 500 mg by mouth daily.    . Vitamin E (VITA-PLUS E PO) Take 800 mg by mouth.    . Zinc 50 MG TABS Take 50 mg by mouth daily.     No current facility-administered medications on file prior to visit.     Allergies  Allergen Reactions  . Doxycycline   . Lisinopril Cough  . Cefuroxime Axetil Nausea And Vomiting    Upset stomach    Social History   Substance and Sexual Activity  Alcohol Use None    Social History   Tobacco Use  Smoking Status Never Smoker  Smokeless Tobacco Never Used    Review of Systems  Constitutional: Negative.   HENT: Negative.   Eyes: Negative.   Respiratory: Negative.   Cardiovascular: Negative.   Gastrointestinal: Negative.   Genitourinary: Negative.   Musculoskeletal: Negative.   Skin: Negative.   Neurological: Negative.   Endo/Heme/Allergies: Negative.  Psychiatric/Behavioral: Negative.     Objective   Vitals:   09/25/18 0859  BP: (!) 144/97  Pulse: 83  Resp: 16  Temp: (!) 97.5 F (36.4 C)  SpO2: 97%    Physical Exam Vitals signs reviewed.  Constitutional:      Appearance: Normal appearance. He is not ill-appearing.  HENT:     Head: Normocephalic and atraumatic.  Cardiovascular:     Rate and Rhythm: Normal rate. Rhythm irregular.     Heart sounds: Normal heart sounds. No murmur. No friction rub. No gallop.   Pulmonary:     Effort: Pulmonary effort is normal. No respiratory distress.     Breath sounds: Normal breath sounds. No stridor. No wheezing, rhonchi or rales.  Abdominal:     General: Bowel sounds are normal. There is no distension.     Palpations: Abdomen is soft. There is no mass.     Tenderness: There is no abdominal tenderness. There is no guarding or rebound.     Hernia: A hernia is  present.     Comments: Easily reducible umbilical hernia present, approximately 2 to 3 cm in size.  Skin:    General: Skin is warm and dry.  Neurological:     Mental Status: He is alert and oriented to person, place, and time.    Primary care notes reviewed Assessment  Umbilical hernia, currently asymptomatic Plan   As patient has had no significant symptoms of incarceration, no need for acute surgical invention at this time.  The signs and symptoms of incarceration were explained to the patient.  He was instructed to return to the office should he develop more abdominal pain from the hernia.  He understands this and agrees.  Literature was given.  Follow-up as needed.

## 2018-10-28 ENCOUNTER — Telehealth: Payer: Self-pay | Admitting: *Deleted

## 2018-10-28 NOTE — Telephone Encounter (Signed)
Pt called and notified that patient assist Eliquis is ready for pick up in office.

## 2018-11-10 DIAGNOSIS — L820 Inflamed seborrheic keratosis: Secondary | ICD-10-CM | POA: Diagnosis not present

## 2018-11-10 DIAGNOSIS — Z85828 Personal history of other malignant neoplasm of skin: Secondary | ICD-10-CM | POA: Diagnosis not present

## 2018-11-10 DIAGNOSIS — Z08 Encounter for follow-up examination after completed treatment for malignant neoplasm: Secondary | ICD-10-CM | POA: Diagnosis not present

## 2018-11-19 ENCOUNTER — Telehealth: Payer: Self-pay | Admitting: Cardiology

## 2018-11-19 NOTE — Telephone Encounter (Signed)

## 2018-12-08 ENCOUNTER — Telehealth (INDEPENDENT_AMBULATORY_CARE_PROVIDER_SITE_OTHER): Payer: Medicare HMO | Admitting: Cardiology

## 2018-12-08 ENCOUNTER — Other Ambulatory Visit: Payer: Self-pay

## 2018-12-08 ENCOUNTER — Encounter: Payer: Self-pay | Admitting: *Deleted

## 2018-12-08 VITALS — BP 135/92 | HR 95 | Ht 68.0 in | Wt 238.0 lb

## 2018-12-08 DIAGNOSIS — E119 Type 2 diabetes mellitus without complications: Secondary | ICD-10-CM

## 2018-12-08 DIAGNOSIS — I4891 Unspecified atrial fibrillation: Secondary | ICD-10-CM | POA: Diagnosis not present

## 2018-12-08 DIAGNOSIS — E782 Mixed hyperlipidemia: Secondary | ICD-10-CM

## 2018-12-08 DIAGNOSIS — I10 Essential (primary) hypertension: Secondary | ICD-10-CM

## 2018-12-08 NOTE — Progress Notes (Signed)
Virtual Visit via Video Note   This visit type was conducted due to national recommendations for restrictions regarding the COVID-19 Pandemic (e.g. social distancing) in an effort to limit this patient's exposure and mitigate transmission in our community.  Due to his co-morbid illnesses, this patient is at least at moderate risk for complications without adequate follow up.  This format is felt to be most appropriate for this patient at this time.  All issues noted in this document were discussed and addressed.  A limited physical exam was performed with this format.  Please refer to the patient's chart for his consent to telehealth for [facility].   Date:  12/08/2018   ID:  Arthur Le, DOB Jun 22, 1950, MRN [id number]  Patient Location: Home Provider Location: Home  PCP:  Celene Squibb, MD  Cardiologist:  Carlyle Dolly, MD  Electrophysiologist:  None   Evaluation Performed:  Follow-Up Visit  Chief Complaint:  Follow up visit  History of Present Illness:    Arthur Le is a 68 y.o. male seen today for follow up of the following medical problems.    1. Aflutter/Afib -CHADS2Vasc score is3, he is on eliquis.   - no recent palpitations - compliant with meds. Denies any bleeding on eliquis.   2. Hyperlipidemia - reports most recent labs with pcp -= compliant with statin   3. 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    Past Medical History:  Diagnosis Date  . Atrial flutter ([place])   . Hyperlipidemia    Past Surgical History:  Procedure Laterality Date  . TONSILLECTOMY AND ADENOIDECTOMY       No outpatient medications have been marked as  taking for the 12/08/18 encounter (Appointment) with Arnoldo Lenis, MD.     Allergies:   Doxycycline, Lisinopril, and Cefuroxime axetil   Social History   Tobacco Use  . Smoking status: Never Smoker  . Smokeless tobacco: Never Used  Substance Use Topics  . Alcohol use: Not on file  . Drug use: Not on file     Family Hx: The patient's family history includes Arrhythmia in his father; Heart attack in his mother.  ROS:   Please see the history of present illness.     All other systems reviewed and are negative.   Prior CV studies:   The following studies were reviewed today:  02/2014 Echo Study Conclusions  - Procedure narrative: Transthoracic echocardiography for left ventricular function evaluation, for right ventricular function evaluation, and for assessment of valvular function. Image quality was suboptimal, with poor valvular and endocardial visualization. Patient was tachycardic throughout exam. - Left ventricle: There was mild concentric hypertrophy. Systolic function was normal. The estimated ejection fraction was in the range of 50% to 55%. Images were inadequate for LV wall motion assessment. The study was not technically sufficient to allow evaluation of LV diastolic dysfunction due to atrial flutter. - Mitral valve: Mildly calcified annulus. Mildly thickened leaflets . There was trivial regurgitation. - Left atrium: The atrium was mildly dilated. - Tricuspid valve: There was trivial regurgitation.  Labs/Other Tests and Data Reviewed:    EKG:  No ECG reviewed.  Recent Labs:  No results found for requested labs within last 8760 hours.   Recent Lipid Panel Lab Results  Component Value Date/Time   CHOL 155 11/07/2016 08:34 AM   TRIG 107 11/07/2016 08:34 AM   HDL 34 (L) 11/07/2016 08:34 AM   CHOLHDL 4.6 11/07/2016 08:34 AM   LDLCALC 101 (H) 11/07/2016 08:34 AM    Wt Readings from Last 3 Encounters:  09/25/18 244 lb (110.7 kg)   12/05/17 261 lb (118.4 kg)  02/12/17 248 lb (112.5 kg)     Objective:    Vital Signs:   Today's Vitals   12/08/18 0807  BP: (!) 135/92  Pulse: 95  Weight: 238 lb (108 kg)  Height: 5\' 8"  (1.727 m)   Body mass index is 36.19 kg/m.  Normal affect. Normal speech pattern and tone. Comfortable, no apparent distress. NO audible signs of SOB or wheezing.   ASSESSMENT & PLAN:    1. Aflutter/Afib - no recent symptoms, continue current meds including anticoag  2. Hyperlipidemia -request pcp labs, continue staitn.   3. DM2 - continue ARB and statin for cardiovacular benefits in setting of DM2  4. HTN -mildly elevated, he will monitor bp's this week and call us in a few days. Likely would increase losartan if needed  COVID-19 Education: The signs and symptoms of COVID-19 were discussed with the patient and how to seek care for testing (follow up with PCP or arrange E-visit).  The importance of social distancing was discussed today.  Time:   Today, I have spent 16 minutes with the patient with telehealth technology discussing the above problems.     Medication Adjustments/Labs and Tests Ordered: Current medicines are reviewed at length with the patient today.  Concerns regarding medicines are outlined above.   Tests Ordered: No orders of the defined types were placed in this encounter.   Medication Changes: No orders of the defined types were placed in this encounter.   Follow Up:  In Person in 1 year(s)  Signed, Carlyle Dolly, MD  12/08/2018 7:20 AM    [place]

## 2018-12-08 NOTE — Patient Instructions (Signed)
Medication Instructions:  Your physician recommends that you continue on your current medications as directed. Please refer to the Current Medication list given to you today.  *If you need a refill on your cardiac medications before your next appointment, please call your pharmacy*  Lab Work: NONE   If you have labs (blood work) drawn today and your tests are completely normal, you will receive your results only by: Marland Kitchen MyChart Message (if you have MyChart) OR . A paper copy in the mail If you have any lab test that is abnormal or we need to change your treatment, we will call you to review the results.  Testing/Procedures: NONE   Follow-Up: At [facility], you and your health needs are our priority.  As part of our continuing mission to provide you with exceptional heart care, we have created designated Provider Care Teams.  These Care Teams include your primary Cardiologist (physician) and Advanced Practice Providers (APPs -  Physician Assistants and Nurse Practitioners) who all work together to provide you with the care you need, when you need it.  Your next appointment:   12 months  The format for your next appointment:   In Person  Provider:   Carlyle Dolly, MD  Other Instructions Your physician has requested that you regularly monitor and record your blood pressure readings at home. Please use the same machine at the same time of day to check your readings and record them to bring to office in 2 weeks.   Thank you for choosing [facility]!

## 2018-12-17 ENCOUNTER — Telehealth: Payer: Self-pay | Admitting: Cardiology

## 2018-12-17 NOTE — Telephone Encounter (Signed)
Patient calling with BP readings

## 2018-12-17 NOTE — Telephone Encounter (Signed)
Called pt. No answer, left message for pt to return call.  

## 2018-12-24 NOTE — Telephone Encounter (Signed)
BP's look fine   Zandra Abts MD

## 2018-12-31 ENCOUNTER — Telehealth: Payer: Self-pay | Admitting: *Deleted

## 2018-12-31 NOTE — Telephone Encounter (Signed)
Eliquis Pt assistance forms faxed to [employer].

## 2019-02-09 ENCOUNTER — Telehealth: Payer: Self-pay | Admitting: *Deleted

## 2019-02-09 NOTE — Telephone Encounter (Signed)
Arthur Le has been approved for the [employer] patient assistance [employer] program from 02/06/19 until 02/05/20.

## 2019-02-16 DIAGNOSIS — K219 Gastro-esophageal reflux disease without esophagitis: Secondary | ICD-10-CM | POA: Diagnosis not present

## 2019-02-16 DIAGNOSIS — I4892 Unspecified atrial flutter: Secondary | ICD-10-CM | POA: Diagnosis not present

## 2019-02-16 DIAGNOSIS — E782 Mixed hyperlipidemia: Secondary | ICD-10-CM | POA: Diagnosis not present

## 2019-02-16 DIAGNOSIS — E1169 Type 2 diabetes mellitus with other specified complication: Secondary | ICD-10-CM | POA: Diagnosis not present

## 2019-02-16 DIAGNOSIS — I4891 Unspecified atrial fibrillation: Secondary | ICD-10-CM | POA: Diagnosis not present

## 2019-02-16 DIAGNOSIS — E041 Nontoxic single thyroid nodule: Secondary | ICD-10-CM | POA: Diagnosis not present

## 2019-02-16 DIAGNOSIS — I10 Essential (primary) hypertension: Secondary | ICD-10-CM | POA: Diagnosis not present

## 2019-03-04 ENCOUNTER — Other Ambulatory Visit: Payer: Self-pay | Admitting: Cardiology

## 2019-03-25 ENCOUNTER — Ambulatory Visit: Payer: Medicare HMO | Attending: Internal Medicine

## 2019-03-25 DIAGNOSIS — Z23 Encounter for immunization: Secondary | ICD-10-CM

## 2019-03-25 NOTE — Progress Notes (Signed)
   Covid-19 Vaccination Clinic  Name:  [name]    MRN: [id number] DOB: 1950-04-23  03/25/2019  Mr. Bartol was observed post Covid-19 immunization for 30 minutes based on pre-vaccination screening without incidence. He was provided with Vaccine Information Sheet and instruction to access the V-Safe system.   Mr. Pickett was instructed to call 911 with any severe reactions post vaccine: Marland Kitchen Difficulty breathing  . Swelling of your face and throat  . A fast heartbeat  . A bad rash all over your body  . Dizziness and weakness    Immunizations Administered    Name Date Dose VIS Date Route   Moderna COVID-19 Vaccine 03/25/2019 10:30 AM 0.5 mL 01/06/2019 Intramuscular   Manufacturer: Moderna   Lot: [id number]   [id number]

## 2019-04-22 ENCOUNTER — Ambulatory Visit: Payer: Medicare HMO | Attending: Internal Medicine

## 2019-04-22 DIAGNOSIS — Z23 Encounter for immunization: Secondary | ICD-10-CM

## 2019-04-22 NOTE — Progress Notes (Signed)
   Covid-19 Vaccination Clinic  Name:  ABDULQADIR Le    MRN: [id number] DOB: 03-15-1950  04/22/2019  Arthur Le was observed post Covid-19 immunization for 15 minutes without incident. He was provided with Vaccine Information Sheet and instruction to access the V-Safe system.   Arthur Le was instructed to call 911 with any severe reactions post vaccine: Marland Kitchen Difficulty breathing  . Swelling of face and throat  . A fast heartbeat  . A bad rash all over body  . Dizziness and weakness   Immunizations Administered    Name Date Dose VIS Date Route   Moderna COVID-19 Vaccine 04/22/2019 11:36 AM 0.5 mL 01/06/2019 Intramuscular   Manufacturer: Moderna   Lot: [id number]   [id number]

## 2019-05-18 DIAGNOSIS — M545 Low back pain: Secondary | ICD-10-CM | POA: Diagnosis not present

## 2019-05-18 DIAGNOSIS — M9902 Segmental and somatic dysfunction of thoracic region: Secondary | ICD-10-CM | POA: Diagnosis not present

## 2019-05-18 DIAGNOSIS — M9901 Segmental and somatic dysfunction of cervical region: Secondary | ICD-10-CM | POA: Diagnosis not present

## 2019-05-18 DIAGNOSIS — M546 Pain in thoracic spine: Secondary | ICD-10-CM | POA: Diagnosis not present

## 2019-05-18 DIAGNOSIS — M9903 Segmental and somatic dysfunction of lumbar region: Secondary | ICD-10-CM | POA: Diagnosis not present

## 2019-05-18 DIAGNOSIS — M542 Cervicalgia: Secondary | ICD-10-CM | POA: Diagnosis not present

## 2019-07-13 DIAGNOSIS — C4362 Malignant melanoma of left upper limb, including shoulder: Secondary | ICD-10-CM | POA: Diagnosis not present

## 2019-07-13 DIAGNOSIS — X32XXXD Exposure to sunlight, subsequent encounter: Secondary | ICD-10-CM | POA: Diagnosis not present

## 2019-07-13 DIAGNOSIS — Z85828 Personal history of other malignant neoplasm of skin: Secondary | ICD-10-CM | POA: Diagnosis not present

## 2019-07-13 DIAGNOSIS — L820 Inflamed seborrheic keratosis: Secondary | ICD-10-CM | POA: Diagnosis not present

## 2019-07-13 DIAGNOSIS — L570 Actinic keratosis: Secondary | ICD-10-CM | POA: Diagnosis not present

## 2019-07-13 DIAGNOSIS — Z08 Encounter for follow-up examination after completed treatment for malignant neoplasm: Secondary | ICD-10-CM | POA: Diagnosis not present

## 2019-07-14 DIAGNOSIS — H524 Presbyopia: Secondary | ICD-10-CM | POA: Diagnosis not present

## 2019-07-14 DIAGNOSIS — H5203 Hypermetropia, bilateral: Secondary | ICD-10-CM | POA: Diagnosis not present

## 2019-07-14 DIAGNOSIS — E1136 Type 2 diabetes mellitus with diabetic cataract: Secondary | ICD-10-CM | POA: Diagnosis not present

## 2019-07-14 DIAGNOSIS — Z794 Long term (current) use of insulin: Secondary | ICD-10-CM | POA: Diagnosis not present

## 2019-07-14 DIAGNOSIS — H25813 Combined forms of age-related cataract, bilateral: Secondary | ICD-10-CM | POA: Diagnosis not present

## 2019-07-14 DIAGNOSIS — H52203 Unspecified astigmatism, bilateral: Secondary | ICD-10-CM | POA: Diagnosis not present

## 2019-07-23 DIAGNOSIS — D485 Neoplasm of uncertain behavior of skin: Secondary | ICD-10-CM | POA: Diagnosis not present

## 2019-07-23 DIAGNOSIS — C4362 Malignant melanoma of left upper limb, including shoulder: Secondary | ICD-10-CM | POA: Diagnosis not present

## 2019-07-24 DIAGNOSIS — M9903 Segmental and somatic dysfunction of lumbar region: Secondary | ICD-10-CM | POA: Diagnosis not present

## 2019-07-24 DIAGNOSIS — M9901 Segmental and somatic dysfunction of cervical region: Secondary | ICD-10-CM | POA: Diagnosis not present

## 2019-07-24 DIAGNOSIS — M545 Low back pain: Secondary | ICD-10-CM | POA: Diagnosis not present

## 2019-07-24 DIAGNOSIS — M546 Pain in thoracic spine: Secondary | ICD-10-CM | POA: Diagnosis not present

## 2019-07-24 DIAGNOSIS — M9902 Segmental and somatic dysfunction of thoracic region: Secondary | ICD-10-CM | POA: Diagnosis not present

## 2019-07-24 DIAGNOSIS — M542 Cervicalgia: Secondary | ICD-10-CM | POA: Diagnosis not present

## 2019-07-29 ENCOUNTER — Other Ambulatory Visit: Payer: Self-pay | Admitting: Cardiology

## 2019-08-06 DIAGNOSIS — C4362 Malignant melanoma of left upper limb, including shoulder: Secondary | ICD-10-CM | POA: Diagnosis not present

## 2019-08-14 DIAGNOSIS — E041 Nontoxic single thyroid nodule: Secondary | ICD-10-CM | POA: Diagnosis not present

## 2019-08-14 DIAGNOSIS — I4892 Unspecified atrial flutter: Secondary | ICD-10-CM | POA: Diagnosis not present

## 2019-08-14 DIAGNOSIS — E1169 Type 2 diabetes mellitus with other specified complication: Secondary | ICD-10-CM | POA: Diagnosis not present

## 2019-08-14 DIAGNOSIS — Z712 Person consulting for explanation of examination or test findings: Secondary | ICD-10-CM | POA: Diagnosis not present

## 2019-08-14 DIAGNOSIS — I4891 Unspecified atrial fibrillation: Secondary | ICD-10-CM | POA: Diagnosis not present

## 2019-08-14 DIAGNOSIS — I10 Essential (primary) hypertension: Secondary | ICD-10-CM | POA: Diagnosis not present

## 2019-08-14 DIAGNOSIS — K219 Gastro-esophageal reflux disease without esophagitis: Secondary | ICD-10-CM | POA: Diagnosis not present

## 2019-08-14 DIAGNOSIS — E782 Mixed hyperlipidemia: Secondary | ICD-10-CM | POA: Diagnosis not present

## 2019-08-17 DIAGNOSIS — I4892 Unspecified atrial flutter: Secondary | ICD-10-CM | POA: Diagnosis not present

## 2019-08-17 DIAGNOSIS — E1169 Type 2 diabetes mellitus with other specified complication: Secondary | ICD-10-CM | POA: Diagnosis not present

## 2019-08-17 DIAGNOSIS — C4362 Malignant melanoma of left upper limb, including shoulder: Secondary | ICD-10-CM | POA: Diagnosis not present

## 2019-08-17 DIAGNOSIS — K219 Gastro-esophageal reflux disease without esophagitis: Secondary | ICD-10-CM | POA: Diagnosis not present

## 2019-08-17 DIAGNOSIS — I4891 Unspecified atrial fibrillation: Secondary | ICD-10-CM | POA: Diagnosis not present

## 2019-08-17 DIAGNOSIS — E041 Nontoxic single thyroid nodule: Secondary | ICD-10-CM | POA: Diagnosis not present

## 2019-08-17 DIAGNOSIS — I10 Essential (primary) hypertension: Secondary | ICD-10-CM | POA: Diagnosis not present

## 2019-08-17 DIAGNOSIS — E782 Mixed hyperlipidemia: Secondary | ICD-10-CM | POA: Diagnosis not present

## 2019-08-28 DIAGNOSIS — I10 Essential (primary) hypertension: Secondary | ICD-10-CM | POA: Diagnosis not present

## 2019-08-28 DIAGNOSIS — I4891 Unspecified atrial fibrillation: Secondary | ICD-10-CM | POA: Diagnosis not present

## 2019-08-28 DIAGNOSIS — C4362 Malignant melanoma of left upper limb, including shoulder: Secondary | ICD-10-CM | POA: Diagnosis not present

## 2019-08-28 DIAGNOSIS — E119 Type 2 diabetes mellitus without complications: Secondary | ICD-10-CM | POA: Diagnosis not present

## 2019-08-28 DIAGNOSIS — K219 Gastro-esophageal reflux disease without esophagitis: Secondary | ICD-10-CM | POA: Diagnosis not present

## 2019-09-04 DIAGNOSIS — Z09 Encounter for follow-up examination after completed treatment for conditions other than malignant neoplasm: Secondary | ICD-10-CM | POA: Diagnosis not present

## 2019-09-04 DIAGNOSIS — C4362 Malignant melanoma of left upper limb, including shoulder: Secondary | ICD-10-CM | POA: Diagnosis not present

## 2019-09-09 DIAGNOSIS — C4362 Malignant melanoma of left upper limb, including shoulder: Secondary | ICD-10-CM | POA: Diagnosis not present

## 2019-09-09 DIAGNOSIS — Z09 Encounter for follow-up examination after completed treatment for conditions other than malignant neoplasm: Secondary | ICD-10-CM | POA: Diagnosis not present

## 2019-09-14 ENCOUNTER — Other Ambulatory Visit: Payer: Self-pay

## 2019-09-14 MED ORDER — DILTIAZEM HCL ER COATED BEADS 120 MG PO CP24: 120.0000 mg | ORAL_CAPSULE | Freq: Every day | ORAL | 3 refills | Status: DC

## 2019-09-14 NOTE — Telephone Encounter (Signed)
Refilled diltiazem 

## 2019-09-21 ENCOUNTER — Other Ambulatory Visit: Payer: Self-pay

## 2019-09-21 MED ORDER — DILTIAZEM HCL ER COATED BEADS 120 MG PO CP24: 120.0000 mg | ORAL_CAPSULE | Freq: Every day | ORAL | 3 refills | Status: DC

## 2019-09-21 NOTE — Telephone Encounter (Signed)
refilled diltiazem

## 2019-09-28 DIAGNOSIS — C4362 Malignant melanoma of left upper limb, including shoulder: Secondary | ICD-10-CM | POA: Diagnosis not present

## 2019-10-14 DIAGNOSIS — M545 Low back pain: Secondary | ICD-10-CM | POA: Diagnosis not present

## 2019-10-14 DIAGNOSIS — M9901 Segmental and somatic dysfunction of cervical region: Secondary | ICD-10-CM | POA: Diagnosis not present

## 2019-10-14 DIAGNOSIS — M542 Cervicalgia: Secondary | ICD-10-CM | POA: Diagnosis not present

## 2019-10-14 DIAGNOSIS — M9902 Segmental and somatic dysfunction of thoracic region: Secondary | ICD-10-CM | POA: Diagnosis not present

## 2019-10-14 DIAGNOSIS — M9903 Segmental and somatic dysfunction of lumbar region: Secondary | ICD-10-CM | POA: Diagnosis not present

## 2019-10-14 DIAGNOSIS — M546 Pain in thoracic spine: Secondary | ICD-10-CM | POA: Diagnosis not present

## 2019-10-19 DIAGNOSIS — M9901 Segmental and somatic dysfunction of cervical region: Secondary | ICD-10-CM | POA: Diagnosis not present

## 2019-10-19 DIAGNOSIS — M9903 Segmental and somatic dysfunction of lumbar region: Secondary | ICD-10-CM | POA: Diagnosis not present

## 2019-10-19 DIAGNOSIS — M545 Low back pain: Secondary | ICD-10-CM | POA: Diagnosis not present

## 2019-10-19 DIAGNOSIS — M546 Pain in thoracic spine: Secondary | ICD-10-CM | POA: Diagnosis not present

## 2019-10-19 DIAGNOSIS — M9902 Segmental and somatic dysfunction of thoracic region: Secondary | ICD-10-CM | POA: Diagnosis not present

## 2019-10-19 DIAGNOSIS — M542 Cervicalgia: Secondary | ICD-10-CM | POA: Diagnosis not present

## 2019-10-21 DIAGNOSIS — M9902 Segmental and somatic dysfunction of thoracic region: Secondary | ICD-10-CM | POA: Diagnosis not present

## 2019-10-21 DIAGNOSIS — M542 Cervicalgia: Secondary | ICD-10-CM | POA: Diagnosis not present

## 2019-10-21 DIAGNOSIS — M9901 Segmental and somatic dysfunction of cervical region: Secondary | ICD-10-CM | POA: Diagnosis not present

## 2019-10-21 DIAGNOSIS — M545 Low back pain: Secondary | ICD-10-CM | POA: Diagnosis not present

## 2019-10-21 DIAGNOSIS — M546 Pain in thoracic spine: Secondary | ICD-10-CM | POA: Diagnosis not present

## 2019-10-21 DIAGNOSIS — M9903 Segmental and somatic dysfunction of lumbar region: Secondary | ICD-10-CM | POA: Diagnosis not present

## 2019-10-26 DIAGNOSIS — M9903 Segmental and somatic dysfunction of lumbar region: Secondary | ICD-10-CM | POA: Diagnosis not present

## 2019-10-26 DIAGNOSIS — M9902 Segmental and somatic dysfunction of thoracic region: Secondary | ICD-10-CM | POA: Diagnosis not present

## 2019-10-26 DIAGNOSIS — M546 Pain in thoracic spine: Secondary | ICD-10-CM | POA: Diagnosis not present

## 2019-10-26 DIAGNOSIS — M542 Cervicalgia: Secondary | ICD-10-CM | POA: Diagnosis not present

## 2019-10-26 DIAGNOSIS — M545 Low back pain: Secondary | ICD-10-CM | POA: Diagnosis not present

## 2019-10-26 DIAGNOSIS — M9901 Segmental and somatic dysfunction of cervical region: Secondary | ICD-10-CM | POA: Diagnosis not present

## 2019-10-28 DIAGNOSIS — M542 Cervicalgia: Secondary | ICD-10-CM | POA: Diagnosis not present

## 2019-10-28 DIAGNOSIS — M9902 Segmental and somatic dysfunction of thoracic region: Secondary | ICD-10-CM | POA: Diagnosis not present

## 2019-10-28 DIAGNOSIS — M546 Pain in thoracic spine: Secondary | ICD-10-CM | POA: Diagnosis not present

## 2019-10-28 DIAGNOSIS — M9903 Segmental and somatic dysfunction of lumbar region: Secondary | ICD-10-CM | POA: Diagnosis not present

## 2019-10-28 DIAGNOSIS — M545 Low back pain: Secondary | ICD-10-CM | POA: Diagnosis not present

## 2019-10-28 DIAGNOSIS — M9901 Segmental and somatic dysfunction of cervical region: Secondary | ICD-10-CM | POA: Diagnosis not present

## 2019-11-02 DIAGNOSIS — C4362 Malignant melanoma of left upper limb, including shoulder: Secondary | ICD-10-CM | POA: Diagnosis not present

## 2019-11-06 DIAGNOSIS — M9901 Segmental and somatic dysfunction of cervical region: Secondary | ICD-10-CM | POA: Diagnosis not present

## 2019-11-06 DIAGNOSIS — M9902 Segmental and somatic dysfunction of thoracic region: Secondary | ICD-10-CM | POA: Diagnosis not present

## 2019-11-06 DIAGNOSIS — M546 Pain in thoracic spine: Secondary | ICD-10-CM | POA: Diagnosis not present

## 2019-11-06 DIAGNOSIS — M9903 Segmental and somatic dysfunction of lumbar region: Secondary | ICD-10-CM | POA: Diagnosis not present

## 2019-11-06 DIAGNOSIS — M542 Cervicalgia: Secondary | ICD-10-CM | POA: Diagnosis not present

## 2019-11-17 ENCOUNTER — Other Ambulatory Visit: Payer: Self-pay | Admitting: *Deleted

## 2019-11-17 MED ORDER — DILTIAZEM HCL ER COATED BEADS 120 MG PO CP24: 120.0000 mg | ORAL_CAPSULE | Freq: Every day | ORAL | 3 refills | Status: DC

## 2019-11-20 DIAGNOSIS — E782 Mixed hyperlipidemia: Secondary | ICD-10-CM | POA: Diagnosis not present

## 2019-11-20 DIAGNOSIS — I10 Essential (primary) hypertension: Secondary | ICD-10-CM | POA: Diagnosis not present

## 2019-11-20 DIAGNOSIS — E1169 Type 2 diabetes mellitus with other specified complication: Secondary | ICD-10-CM | POA: Diagnosis not present

## 2019-11-20 DIAGNOSIS — Z712 Person consulting for explanation of examination or test findings: Secondary | ICD-10-CM | POA: Diagnosis not present

## 2019-11-20 DIAGNOSIS — K219 Gastro-esophageal reflux disease without esophagitis: Secondary | ICD-10-CM | POA: Diagnosis not present

## 2019-11-20 DIAGNOSIS — I4891 Unspecified atrial fibrillation: Secondary | ICD-10-CM | POA: Diagnosis not present

## 2019-11-20 DIAGNOSIS — C4362 Malignant melanoma of left upper limb, including shoulder: Secondary | ICD-10-CM | POA: Diagnosis not present

## 2019-11-20 DIAGNOSIS — I4892 Unspecified atrial flutter: Secondary | ICD-10-CM | POA: Diagnosis not present

## 2019-11-25 ENCOUNTER — Other Ambulatory Visit: Payer: Self-pay

## 2019-11-25 DIAGNOSIS — I10 Essential (primary) hypertension: Secondary | ICD-10-CM | POA: Diagnosis not present

## 2019-11-25 DIAGNOSIS — E041 Nontoxic single thyroid nodule: Secondary | ICD-10-CM | POA: Diagnosis not present

## 2019-11-25 DIAGNOSIS — K219 Gastro-esophageal reflux disease without esophagitis: Secondary | ICD-10-CM | POA: Diagnosis not present

## 2019-11-25 DIAGNOSIS — I4891 Unspecified atrial fibrillation: Secondary | ICD-10-CM | POA: Diagnosis not present

## 2019-11-25 DIAGNOSIS — E782 Mixed hyperlipidemia: Secondary | ICD-10-CM | POA: Diagnosis not present

## 2019-11-25 DIAGNOSIS — C4362 Malignant melanoma of left upper limb, including shoulder: Secondary | ICD-10-CM | POA: Diagnosis not present

## 2019-11-25 DIAGNOSIS — Z0001 Encounter for general adult medical examination with abnormal findings: Secondary | ICD-10-CM | POA: Diagnosis not present

## 2019-11-25 DIAGNOSIS — E1169 Type 2 diabetes mellitus with other specified complication: Secondary | ICD-10-CM | POA: Diagnosis not present

## 2019-11-25 DIAGNOSIS — I4892 Unspecified atrial flutter: Secondary | ICD-10-CM | POA: Diagnosis not present

## 2019-11-25 MED ORDER — DILTIAZEM HCL ER COATED BEADS 120 MG PO CP24: 120.0000 mg | ORAL_CAPSULE | Freq: Every day | ORAL | 3 refills | Status: DC

## 2019-11-25 NOTE — Telephone Encounter (Signed)
Refilled diltiazem 120 mg qd #90

## 2019-11-30 DIAGNOSIS — C4362 Malignant melanoma of left upper limb, including shoulder: Secondary | ICD-10-CM | POA: Diagnosis not present

## 2019-12-07 DIAGNOSIS — M0520 Rheumatoid vasculitis with rheumatoid arthritis of unspecified site: Secondary | ICD-10-CM | POA: Insufficient documentation

## 2019-12-10 ENCOUNTER — Other Ambulatory Visit: Payer: Self-pay

## 2019-12-10 ENCOUNTER — Encounter: Payer: Self-pay | Admitting: Orthopedic Surgery

## 2019-12-10 ENCOUNTER — Ambulatory Visit: Payer: Medicare HMO

## 2019-12-10 ENCOUNTER — Ambulatory Visit: Payer: Medicare HMO | Admitting: Orthopedic Surgery

## 2019-12-10 VITALS — Ht 68.0 in | Wt 252.0 lb

## 2019-12-10 DIAGNOSIS — G8929 Other chronic pain: Secondary | ICD-10-CM | POA: Diagnosis not present

## 2019-12-10 DIAGNOSIS — M25511 Pain in right shoulder: Secondary | ICD-10-CM | POA: Diagnosis not present

## 2019-12-10 DIAGNOSIS — M25512 Pain in left shoulder: Secondary | ICD-10-CM | POA: Diagnosis not present

## 2019-12-10 DIAGNOSIS — M7501 Adhesive capsulitis of right shoulder: Secondary | ICD-10-CM

## 2019-12-10 NOTE — Progress Notes (Signed)
Chief Complaint  Patient presents with  . Shoulder Pain    Bilateral Rt > Lt for 2 months after surgery     69 year old male with diabetes atrial fibrillation on Eliquis also has some hypertension referred to [place] by chiropractor for stiff right shoulder painful left shoulder  Notes reviewed from the chiropractor he is treated him for neck pain seems to have gotten better  Primary care doctor is Dr. [place] Crane his notes indicate patient's hypertension diabetes are under control and his A. 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 Atrial flutter ([place])   . Hyperlipidemia    Review of systems notes hearing loss no fever no blurred vision heart palpitations from A. fib no shortness of breath no GI GU symptoms denies pollen allergy or easy bruising bleeding no dizziness headache tingling numbness sensory changes speech change weakness or seizure no loss of consciousness depression suicidal or hallucinations no illicit drug use  Past Medical History:  Diagnosis Date  . Atrial flutter ([place])   . Hyperlipidemia     Past Surgical History:  Procedure Laterality Date  . TONSILLECTOMY AND ADENOIDECTOMY     Social History   Tobacco Use  . Smoking status: Never Smoker  . Smokeless tobacco: Never Used  Vaping Use  . Vaping Use: Never used  Substance Use Topics  . Alcohol use: Not on file  . Drug use: Not on file    Ht 5\' 8"  (1.727 m)   Wt 252 lb (114.3 kg)   BMI 38.32 kg/m   Physical Exam Constitutional:      General: He is  not in acute distress.    Appearance: He is well-developed.  Cardiovascular:     Comments: No peripheral edema Skin:    General: Skin is warm and dry.  Neurological:     Mental Status: He is alert and oriented to person, place, and time.     Sensory: No sensory deficit.     Coordination: Coordination normal.     Gait: Gait normal.     Deep Tendon Reflexes: Reflexes are normal and symmetric.    Right shoulder active range of motion he can only abduct his shoulder 30 degrees passive range of motion abduction approximately the same is external rotation is limited to 30 degrees active and passive there is no tenderness or warmth of the right shoulder the skin is normal his grip strength is normal he has a good pulse and perfusion  Left shoulder he has full active range of motion with pain no weakness but pain in the empty can position shoulder is stable skin is normal  X-rays of the right shoulder and left shoulder were normal with no glenohumeral arthritis he did have some AC joint arthritis expected for age  Encounter Diagnoses  Name Primary?  . Chronic left shoulder pain Yes  . Chronic right shoulder pain   . Adhesive capsulitis of right shoulder  Procedure injection right shoulder subacromial joint and left shoulder subacromial joint   procedure note the subacromial injection shoulder RIGHT  Verbal consent was obtained to inject the  RIGHT   Shoulder  Timeout was completed to confirm the injection site is a subacromial space of the  RIGHT  shoulder   Medication used Depo-Medrol 40 mg and lidocaine 1% 3 cc  Anesthesia was provided by ethyl chloride  The injection was performed in the RIGHT  posterior subacromial space. After pinning the skin with alcohol and anesthetized the skin with ethyl chloride the subacromial space was injected using a 20-gauge needle. There were no complications  Sterile dressing was applied.    Procedure note the subacromial injection shoulder  left   Verbal consent was obtained to inject the  Left   Shoulder  Timeout was completed to confirm the injection site is a subacromial space of the  left  shoulder  Medication used Depo-Medrol 40 mg and lidocaine 1% 3 cc  Anesthesia was provided by ethyl chloride  The injection was performed in the left  posterior subacromial space. After pinning the skin with alcohol and anesthetized the skin with ethyl chloride the subacromial space was injected using a 20-gauge needle. There were no complications  Sterile dressing was applied.   Assessment:  His right shoulder has adhesive capsulitis will need physical therapy  His left shoulder should do well with injection  Recommend physical therapy and follow-up in 8 weeks

## 2019-12-18 ENCOUNTER — Ambulatory Visit (HOSPITAL_COMMUNITY): Payer: Medicare HMO | Attending: Orthopedic Surgery

## 2019-12-18 ENCOUNTER — Encounter (HOSPITAL_COMMUNITY): Payer: Self-pay

## 2019-12-18 ENCOUNTER — Other Ambulatory Visit: Payer: Self-pay

## 2019-12-18 DIAGNOSIS — M25511 Pain in right shoulder: Secondary | ICD-10-CM | POA: Insufficient documentation

## 2019-12-18 DIAGNOSIS — R29898 Other symptoms and signs involving the musculoskeletal system: Secondary | ICD-10-CM | POA: Diagnosis not present

## 2019-12-18 DIAGNOSIS — M25611 Stiffness of right shoulder, not elsewhere classified: Secondary | ICD-10-CM | POA: Diagnosis not present

## 2019-12-18 NOTE — Therapy (Signed)
[facility], [place], [address] Phone: [phone]   Fax:  [phone]  Occupational Therapy Evaluation  Patient Details  Name: Arthur Le MRN: [id number] Date of Birth: 1950/12/02 Referring Provider (OT): Arther Abbott, MD   Encounter Date: 12/18/2019   OT End of Session - 12/18/19 1224    Visit Number 1    Number of Visits 9    Date for OT Re-Evaluation 01/15/20    Authorization Type [employer] HMO, no visit limit, $40 copay    OT Start Time 1107    OT Stop Time 1159    OT Time Calculation (min) 52 min    Activity Tolerance Patient tolerated treatment well    Behavior During Therapy [facility] for tasks assessed/performed           Past Medical History:  Diagnosis Date  . Atrial flutter ([place])   . Hyperlipidemia     Past Surgical History:  Procedure Laterality Date  . TONSILLECTOMY AND ADENOIDECTOMY      There were no vitals filed for this visit.   Subjective Assessment - 12/18/19 1110    Subjective  S: The steroid injection fixed everything it seems like    Pertinent History Patient is a 69 y/o male who is referred by Dr. Aline Brochure for OT evaluation and treatment of right shoulder adhesive capsulitis. Patient reports that it started out as pain about 3 months ago that he noticed initially starting during his chiropractic visits that he was attending for his cervical pain. Eventually, his shoulder locked up to a point where he couldn't move his shoulder at all and that's when he sought out medical care from MD. Patient reports having a steroid injection a week ago, with significant improvements in his pain an ROM. He denies significant challenges with ADLs but pain is limiting factor for him.    Special Tests complete next session    Patient Stated Goals less pain    Currently in Pain? Yes   0 right now, 3 at its worst   Pain Score 3     Pain Location Shoulder    Pain Orientation  Right;Anterior;Upper    Pain Descriptors / Indicators Sore;Aching    Pain Type Acute pain    Pain Radiating Towards n/a    Pain Onset More than a month ago    Pain Frequency Intermittent    Aggravating Factors  any movement    Pain Relieving Factors steroid injection, ice    Effect of Pain on Daily Activities before steroid injection mod to max, right now min    Multiple Pain Sites No             [facility] OT Assessment - 12/18/19 1115      Assessment   Medical Diagnosis Right shoulder adhesive capsulitis    Referring Provider (OT) Arther Abbott, MD    Onset Date/Surgical Date --   3 months ago   Hand Dominance Right    Next MD Visit 02/03/20    Prior Therapy none prior      Precautions   Precautions None      Restrictions   Weight Bearing Restrictions No      Balance Screen   Has the patient fallen in the past 6 months No      Prior Function   Level of Independence Independent    Vocation Part time employment    Leisure Musical instruments, golfing      ADL  ADL comments Patient reports that prior to the steroid injection, he was unable to play musical instruments, play golf, washing hair, drying his back.       Cognition   Overall Cognitive Status Within Functional Limits for tasks assessed      ROM / Strength   AROM / PROM / Strength AROM;PROM;Strength      Palpation   Palpation comment Max fascial restrictions palpated on the right trapezius and scapularis regions      AROM   Overall AROM Comments Assessed seated, IR/er adducted    AROM Assessment Site Shoulder    Right/Left Shoulder Right    Right Shoulder Flexion 150 Degrees    Right Shoulder ABduction 125 Degrees    Right Shoulder Internal Rotation 90 Degrees    Right Shoulder External Rotation 53 Degrees      PROM   Overall PROM Comments Assessed supine, IR/er adducted    PROM Assessment Site Shoulder    Right/Left Shoulder Right    Right Shoulder Flexion 160 Degrees    Right Shoulder ABduction  165 Degrees    Right Shoulder Internal Rotation 90 Degrees    Right Shoulder External Rotation 65 Degrees      Strength   Overall Strength Comments Assessed seated, IR/er adducted. Upper trapezius 5/5, middle trapezius 4-/5, lower trapezius 4/5    Strength Assessment Site Shoulder    Right/Left Shoulder Right    Right Shoulder Flexion 5/5    Right Shoulder ABduction 5/5    Right Shoulder Internal Rotation 5/5    Right Shoulder External Rotation 4+/5                             OT Short Term Goals - 12/18/19 1301      OT SHORT TERM GOAL #1   Title Patient will be educated and independent in a HEP in order to promote increased functional use of his RUE as dominant.    Time 4    Period Weeks    Status New    Target Date 01/15/20      OT SHORT TERM GOAL #2   Title Patient will decrease fascial restrictions of the RUE trapezius and scapularis regions to at least a min-mod level to improve mobility in a pain free zone.    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    Time 4    Period Weeks    Status New      OT SHORT TERM GOAL #4   Title Patient will improve RUE to WNL to improve the functional use of his dominant arm in overhead reaching and other daily tasks.    Time 4    Period Weeks    Status New      OT SHORT TERM GOAL #5   Title Patient will report a decrease in pain level of about 1/10 in the RUE while using his right hand to push himself up from a chair in order to promote ease with functional mobility and positional changes.    Time 4    Period Weeks    Status New  Plan - 12/18/19 1255    Clinical Impression Statement A: Patient is a 69 y/o male with right shoulder adhesive capsulitis, causing  increased pain and fascial restrictions and decreased A/ROM and strength, which inhibits his ability to participate in ADLs efficiently and pain free.    OT Occupational Profile and History Problem Focused Assessment - Including review of records relating to presenting problem    Occupational performance deficits (Please refer to evaluation for details): IADL's;Work;Leisure    Body Structure / Function / Physical Skills Endurance;ROM;Fascial restriction;Muscle spasms;Strength;Pain    Rehab Potential Good    Clinical Decision Making Several treatment options, min-mod task modification necessary    Comorbidities Affecting Occupational Performance: Presence of comorbidities impacting occupational performance    Comorbidities impacting occupational performance description: Cervicalgia    Modification or Assistance to Complete Evaluation  Min-Moderate modification of tasks or assist with assess necessary to complete eval    OT Frequency 2x / week    OT Duration 4 weeks    OT Treatment/Interventions Ultrasound;Therapeutic exercise;Manual Therapy;Moist Heat;Passive range of motion;Patient/family education;Electrical Stimulation;Other (comment)   posture education   Plan P: Patient will benefit from skilled OT intervention to decrease fascial restrictions of the trapezius and scapularis region, decrease pain, and increase A/ROM and strength to promote pain free participation in his daily activities. Treatment plan: manual techniques, passive stretching, A/ROM, scapular strengthening, posture education, modalities prn. NEXT SESSION: complete FOTO, follow up on HEP    OT Home Exercise Plan Eval: shoulder stretches    Consulted and Agree with Plan of Care Patient           Patient will benefit from skilled therapeutic intervention in order to improve the following deficits and impairments:   Body Structure / Function / Physical Skills: Endurance, ROM, Fascial restriction, Muscle spasms, Strength, Pain        Visit Diagnosis: Acute pain of right shoulder  Other symptoms and signs involving the musculoskeletal system  Stiffness of right shoulder, not elsewhere classified    Problem List Patient Active Problem List   Diagnosis Date Noted  . Atrial flutter ([place]) 02/19/2014  . Cardiac risk counseling 02/17/2014  . Hyperlipemia 02/16/2014  . Obesity 02/16/2014     [place], [place] Student [phone]   12/18/2019, 1:15 PM  [facility] [address] [place], [place], [address] Phone: [phone]   Fax:  [phone]  Name: CADENCE HASLAM MRN: [id number] Date of Birth: 1950/02/19

## 2019-12-18 NOTE — Patient Instructions (Signed)
Complete the following exercises at least once a day.  Doorway Stretch  Place each hand opposite each other on the doorway. (You can change where you feel the stretch by moving arms higher or lower.) Step through with one foot and bend front knee until a stretch is felt and hold. Step through with the opposite foot on the next rep. Hold for __10-15___ seconds. Repeat __5__times.     Scapular Retraction (Standing)   With arms at sides, pinch shoulder blades together. Repeat __10__ times per set. Do __1__ sets per session. Do __2__ sessions per day.  http://orth.exer.us/944   Copyright  VHI. All rights reserved.   Posterior Capsule Stretch   Stand or sit, one arm across body so hand rests over opposite shoulder. Gently push on crossed elbow with other hand until stretch is felt in shoulder of crossed arm. Hold _10-15__ seconds.  Repeat _2__ times per session. Do 2 sessions per day.   Wall Flexion  Slide your arm up the wall or door frame until a stretch is felt in your shoulder . Hold for 10-15 seconds. Complete 2 times     Shoulder Abduction Stretch  Stand side ways by a wall with affected up on wall. Gently step in toward wall to feel stretch. Hold for 10-15 seconds. Complete 2 times.

## 2019-12-18 NOTE — Addendum Note (Signed)
Addended by: Ailene Ravel D on: 12/18/2019 01:28 PM   Modules accepted: Orders

## 2019-12-21 ENCOUNTER — Ambulatory Visit (HOSPITAL_COMMUNITY): Payer: Medicare HMO

## 2019-12-21 ENCOUNTER — Encounter (HOSPITAL_COMMUNITY): Payer: Self-pay

## 2019-12-21 ENCOUNTER — Other Ambulatory Visit: Payer: Self-pay

## 2019-12-21 DIAGNOSIS — M25611 Stiffness of right shoulder, not elsewhere classified: Secondary | ICD-10-CM

## 2019-12-21 DIAGNOSIS — M25511 Pain in right shoulder: Secondary | ICD-10-CM

## 2019-12-21 DIAGNOSIS — R29898 Other symptoms and signs involving the musculoskeletal system: Secondary | ICD-10-CM

## 2019-12-21 NOTE — Patient Instructions (Signed)
1) (Home) Extension: Isometric / Bilateral Arm Retraction - Sitting   Facing anchor, hold hands and elbow at shoulder height, with elbow bent.  Pull arms back to squeeze shoulder blades together. Repeat 10-15 times. 1-2 times/day.   2) (Clinic) Extension / Flexion (Assist)   Face anchor, pull arms back, keeping elbow straight, and squeze shoulder blades together. Repeat 10-15 times. 1-2 times/day.   Copyright  VHI. All rights reserved.   3) (Home) Retraction: Row - Bilateral (Anchor)   Facing anchor, arms reaching forward, pull hands toward stomach, keeping elbows bent and at your sides and pinching shoulder blades together. Repeat 10-15 times. 1-2 times/day.   Copyright  VHI. All rights reserved.   ELASTIC BAND SHOULDER EXTERNAL ROTATION - ER  While holding an elastic band at your side with your elbow bent, start with your hand near your stomach and then pull the band away. Keep your elbow at your side the entire time. Complete 10-15 times. 1-2 times a day

## 2019-12-21 NOTE — Therapy (Signed)
[place], [place], [address] Phone: [phone]   Fax:  [phone]  Occupational Therapy Treatment  Patient Details  Name: Arthur Le MRN: [id number] Date of Birth: 1950/09/25 Referring Provider (OT): Arther Abbott, MD   Encounter Date: 12/21/2019   OT End of Session - 12/21/19 1814    Visit Number 2    Number of Visits 9    Date for OT Re-Evaluation 01/15/20    Authorization Type --    Authorization Time Period [place] approved 8 visits (12/18/19-01/15/20)    Authorization - Visit Number 1    Authorization - Number of Visits 8    Progress Note Due on Visit 10    OT Start Time 1730    OT Stop Time 1808    OT Time Calculation (min) 38 min    Activity Tolerance Patient tolerated treatment well    Behavior During Therapy [facility] for tasks assessed/performed           Past Medical History:  Diagnosis Date  . Atrial flutter ([place])   . Hyperlipidemia     Past Surgical History:  Procedure Laterality Date  . TONSILLECTOMY AND ADENOIDECTOMY      There were no vitals filed for this visit.       [facility] OT Assessment - 12/21/19 1750      Assessment   Medical Diagnosis Right shoulder adhesive capsulitis      Precautions   Precautions None      Observation/Other Assessments   Focus on Therapeutic Outcomes (FOTO)  83/100                    OT Treatments/Exercises (OP) - 12/21/19 1754      Exercises   Exercises Shoulder      Shoulder Exercises: Supine   Protraction PROM;5 reps    Horizontal ABduction PROM;5 reps    External Rotation PROM;5 reps   abducted   Internal Rotation PROM;5 reps   abducted   Flexion PROM;5 reps    ABduction PROM;12 reps      Shoulder Exercises: Standing   External Rotation Theraband;12 reps   towel roll   Theraband Level (Shoulder External Rotation) Level 3 (Green)    Extension [employer] reps    Theraband Level (Shoulder Extension) Level 3 (Green)     Row [employer] reps    Theraband Level (Shoulder Row) Level 3 (Green)    Retraction Theraband;12 reps    Theraband Level (Shoulder Retraction) Level 3 (Green)      Shoulder Exercises: Therapy Ball   Right/Left --   2 reps both directions     Shoulder Exercises: ROM/Strengthening   UBE (Upper Arm Bike) Level 3 3' reverse only    pace: 10-12     Manual Therapy   Manual Therapy Myofascial release                  OT Education - 12/21/19 1804    Education Details Reviewed therapy goals. Provided scapular strengthening and er strengthening with green band.    Person(s) Educated Patient    Methods Explanation;Demonstration;Verbal cues;Handout;Tactile cues    Comprehension Verbalized understanding;Returned demonstration            OT Short Term Goals - 12/21/19 1742      OT SHORT TERM GOAL #1   Title Patient will be educated and independent in a HEP in order to promote increased functional use of his RUE as dominant.  Time 4    Period Weeks    Status On-going    Target Date 01/15/20      OT SHORT TERM GOAL #2   Title Patient will decrease fascial restrictions of the RUE trapezius and scapularis regions to at least a min-mod level to improve mobility in a pain free zone.    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    Time 4    Period Weeks    Status On-going      OT SHORT TERM GOAL #4   Title Patient will improve RUE to WNL to improve the functional use of his dominant arm in overhead reaching and other daily tasks.    Time 4    Period Weeks    Status On-going      OT SHORT TERM GOAL #5   Title Patient will report a decrease in pain level of about 1/10 in the RUE while using his right hand to push himself up from a chair in  order to promote ease with functional mobility and positional changes.    Time 4    Period Weeks    Status On-going                    Plan - 12/21/19 1815    Clinical Impression Statement A: Initiated myofascial release and manual stretching followed by scapular strengthening. Patient presented with minimal fascial restrictions in right upper trapezius and scapular region. VC for form and technique were provided during session. Added scapular strengthening to HEP.    Body Structure / Function / Physical Skills Endurance;ROM;Fascial restriction;Muscle spasms;Strength;Pain    Plan P: follow up on HEP. Add therapy ball strengthening.    Consulted and Agree with Plan of Care Patient           Patient will benefit from skilled therapeutic intervention in order to improve the following deficits and impairments:   Body Structure / Function / Physical Skills: Endurance, ROM, Fascial restriction, Muscle spasms, Strength, Pain       Visit Diagnosis: Acute pain of right shoulder  Other symptoms and signs involving the musculoskeletal system  Stiffness of right shoulder, not elsewhere classified    Problem List Patient Active Problem List   Diagnosis Date Noted  . Atrial flutter ([place]) 02/19/2014  . Cardiac risk counseling 02/17/2014  . Hyperlipemia 02/16/2014  . Obesity 02/16/2014   Ailene Ravel, OTR/L,CBIS  [phone]  12/21/2019, 6:19 PM  [facility] [address] [place], [place], [address] Phone: [phone]   Fax:  [phone]  Name: Arthur Le MRN: [id number] Date of Birth: 12-22-1950

## 2019-12-23 ENCOUNTER — Other Ambulatory Visit: Payer: Self-pay

## 2019-12-23 ENCOUNTER — Encounter (HOSPITAL_COMMUNITY): Payer: Self-pay

## 2019-12-23 ENCOUNTER — Ambulatory Visit (HOSPITAL_COMMUNITY): Payer: Medicare HMO

## 2019-12-23 DIAGNOSIS — R29898 Other symptoms and signs involving the musculoskeletal system: Secondary | ICD-10-CM | POA: Diagnosis not present

## 2019-12-23 DIAGNOSIS — M25611 Stiffness of right shoulder, not elsewhere classified: Secondary | ICD-10-CM | POA: Diagnosis not present

## 2019-12-23 DIAGNOSIS — M25511 Pain in right shoulder: Secondary | ICD-10-CM

## 2019-12-23 NOTE — Therapy (Addendum)
[place], [place], [address] Phone: [phone]   Fax:  [phone]  Occupational Therapy Treatment  Patient Details  Name: Arthur Le MRN: [id number] Date of Birth: March 03, 1950 Referring Provider (OT): Arther Abbott, MD   Encounter Date: 12/23/2019   OT End of Session - 12/23/19 1055    Visit Number 3    Number of Visits 9    Date for OT Re-Evaluation 01/15/20    Authorization Type [employer] H[employer], no visit limit, $40 copay    Authorization Time Period [place] approved 8 visits (12/18/19-01/15/20)    Authorization - Visit Number 2   Authorization - Number of Visits 8    Progress Note Due on Visit 10    OT Start Time 1030    OT Stop Time 1108    OT Time Calculation (min) 38 min    Activity Tolerance Patient tolerated treatment well    Behavior During Therapy [facility] for tasks assessed/performed           Past Medical History:  Diagnosis Date  . Atrial flutter ([place])   . Hyperlipidemia     Past Surgical History:  Procedure Laterality Date  . TONSILLECTOMY AND ADENOIDECTOMY      There were no vitals filed for this visit.   Subjective Assessment - 12/23/19 1050    Subjective  S: It was really sore the day after our session. It's not as bad today though.    Currently in Pain? Yes    Pain Score 3     Pain Location Shoulder    Pain Orientation Right    Pain Descriptors / Indicators Sore    Pain Type Acute pain    Pain Radiating Towards N/A    Pain Onset In the past 7 days    Pain Frequency Intermittent    Aggravating Factors  exercises during therapy session.    Pain Relieving Factors stretches on door.    Effect of Pain on Daily Activities min effect    Multiple Pain Sites No              [facility] OT Assessment - 12/23/19 1052      Assessment   Medical Diagnosis Right shoulder adhesive capsulitis      Precautions   Precautions None                    OT  Treatments/Exercises (OP) - 12/23/19 1052      Exercises   Exercises Shoulder      Shoulder Exercises: Supine   Protraction PROM;5 reps    Horizontal ABduction PROM;5 reps    External Rotation PROM;5 reps   abducted   Internal Rotation PROM;5 reps   abducted   Flexion PROM;5 reps    ABduction PROM;12 reps      Shoulder Exercises: Therapy Ball   Other Therapy Ball Exercises Green therapy ball; 12X; chest press, circles, flexion      Shoulder Exercises: ROM/Strengthening   UBE (Upper Arm Bike) Level 3 3' reverse only    pace: 10-12   Proximal Shoulder Strengthening, Seated 10X A/ROM no rest breaks      Shoulder Exercises: Stretch   Wall Stretch - Flexion 2 reps;20 seconds    Wall Stretch - ABduction 2 reps;20 seconds    Star Gazer Stretch 2 reps;20 seconds                    OT Short Term Goals -  12/21/19 1742      OT SHORT TERM GOAL #1   Title Patient will be educated and independent in a HEP in order to promote increased functional use of his RUE as dominant.    Time 4    Period Weeks    Status On-going    Target Date 01/15/20      OT SHORT TERM GOAL #2   Title Patient will decrease fascial restrictions of the RUE trapezius and scapularis regions to at least a min-mod level to improve mobility in a pain free zone.    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    Time 4    Period Weeks    Status On-going      OT SHORT TERM GOAL #4   Title Patient will improve RUE to WNL to improve the functional use of his dominant arm in overhead reaching and other daily tasks.    Time 4    Period Weeks    Status On-going      OT SHORT TERM GOAL #5   Title Patient will report a decrease in pain level of about 1/10 in the RUE while using his  right hand to push himself up from a chair in order to promote ease with functional mobility and positional changes.    Time 4    Period Weeks    Status On-going                    Plan - 12/23/19 1113    Clinical Impression Statement A: Pt presented with min-mod fascial restrictions in right upper arm and upper trapezius region with manual techniques completed to address. Focused on functional mobility using A/ROM and shoulder stretches. Added light strengthening using green therapy ball and proximal shoulder strengthening standing. VC for and technique. patient able to tolerate all exercises. Encouraged use of ice pack upon returning home for 10 minutes to help decrease soreness level later on.    Body Structure / Function / Physical Skills Endurance;ROM;Fascial restriction;Muscle spasms;Strength;Pain    Plan P: Continue with A/ROM and proximal shoulder strengthening.    Consulted and Agree with Plan of Care Patient           Patient will benefit from skilled therapeutic intervention in order to improve the following deficits and impairments:   Body Structure / Function / Physical Skills: Endurance, ROM, Fascial restriction, Muscle spasms, Strength, Pain       Visit Diagnosis: Other symptoms and signs involving the musculoskeletal system  Acute pain of right shoulder  Stiffness of right shoulder, not elsewhere classified    Problem List Patient Active Problem List   Diagnosis Date Noted  . Atrial flutter ([place]) 02/19/2014  . Cardiac risk counseling 02/17/2014  . Hyperlipemia 02/16/2014  . Obesity 02/16/2014   Ailene Ravel, OTR/L,CBIS  [phone]  12/23/2019, 11:15 AM  [facility] [address] [place], [place], [address] Phone: [phone]   Fax:  [phone]  Name: Arthur Le MRN: [id number] Date of Birth: Jan 11, 1951

## 2019-12-28 ENCOUNTER — Other Ambulatory Visit: Payer: Self-pay

## 2019-12-28 ENCOUNTER — Ambulatory Visit (HOSPITAL_COMMUNITY): Payer: Medicare HMO

## 2019-12-28 ENCOUNTER — Encounter (HOSPITAL_COMMUNITY): Payer: Self-pay

## 2019-12-28 DIAGNOSIS — M25511 Pain in right shoulder: Secondary | ICD-10-CM | POA: Diagnosis not present

## 2019-12-28 DIAGNOSIS — R29898 Other symptoms and signs involving the musculoskeletal system: Secondary | ICD-10-CM | POA: Diagnosis not present

## 2019-12-28 DIAGNOSIS — M25611 Stiffness of right shoulder, not elsewhere classified: Secondary | ICD-10-CM | POA: Diagnosis not present

## 2019-12-28 NOTE — Progress Notes (Signed)
Cardiology Office Note    Date:  01/05/2020   ID:  Arthur Le, DOB Jun 02, 1950, MRN [id number]  PCP:  Celene Squibb, MD  Cardiologist: Carlyle Dolly, MD EPS: None  No chief complaint on file.   History of Present Illness:  Arthur Le is a 69 y.o. male with history of paroxysmal atrial flutter/fibrillation on Eliquis for CHA2DS2-VASc of 3, hypertension, HLD, DM 2.    Last saw Dr. Harl Bowie via telemedicine on 12/08/2018 which time blood pressure was slightly elevated and he monitored at home and they were stable so no increase made.  Since patient was here last he had a melanoma removed from his left arm but lymph nodes were negative. Denies Chest pain, palpitations, edema. No regular exercise. 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 BP running 120-140/70 at home.   Past Medical History:  Diagnosis Date  . Atrial flutter ([place])   . Hyperlipidemia     Past Surgical History:  Procedure Laterality Date  . TONSILLECTOMY AND ADENOIDECTOMY      Current Medications: Current Meds  Medication Sig  . apixaban (ELIQUIS) 5 MG TABS tablet Take 1 tablet (5 mg total) by mouth 2 (two) times daily.  Marland Kitchen atorvastatin (LIPITOR) 40 MG tablet Take 40 mg by mouth daily.  . Cholecalciferol (VITAMIN D-3 PO) Take 5,000 Int'l Units by mouth.  . Coenzyme Q10 (COQ-10) 400 MG CAPS Take 1 capsule by mouth daily.  Marland Kitchen diltiazem (CARDIZEM CD) 120 MG 24 hr capsule Take 1 capsule (120 mg total) by mouth daily.  Marland Kitchen ezetimibe (ZETIA) 10 MG tablet Take by mouth.  . furosemide (LASIX) 20 MG tablet Take 20 mg by mouth as needed.  . Lecithin 1200 MG CAPS Take 1,200 mg by mouth daily.  Marland Kitchen loratadine (CLARITIN) 10 MG tablet Take 10 mg by mouth daily.  Marland Kitchen losartan (COZAAR) 25 MG tablet TAKE 1/2 TABLET EVERY DAY  . metFORMIN (GLUCOPHAGE) 500 MG tablet Take 500 mg by mouth 2 (two) times daily with a meal.  . metoprolol tartrate (LOPRESSOR) 100 MG tablet TAKE 1 TABLET TWICE DAILY  . Misc  Natural Products (GINKOGIN PO) Take 240 mg by mouth daily.  . Omega-3 Fatty Acids (FISH OIL) 1000 MG CAPS Take 1,000 mg by mouth daily.  Marland Kitchen omeprazole (PRILOSEC) 20 MG capsule Take 20 mg by mouth daily.  . vitamin C (ASCORBIC ACID) 500 MG tablet Take 500 mg by mouth daily.  . Zinc 50 MG TABS Take 50 mg by mouth daily.     Allergies:   Doxycycline, Lisinopril, and Cefuroxime axetil   Social History   Socioeconomic History  . Marital status: Married    Spouse name: Not on file  . Number of children: Not on file  . Years of education: Not on file  . Highest education level: Not on file  Occupational History  . Not on file  Tobacco Use  . Smoking status: Never Smoker  . Smokeless tobacco: Never Used  Vaping Use  . Vaping Use: Never used  Substance and Sexual Activity  . Alcohol use: Not on file  . Drug use: Not on file  . Sexual activity: Not on file  Other Topics Concern  . Not on file  Social History Narrative  . Not on file   Social Determinants of Health   Financial Resource Strain:   . Difficulty of Paying Living Expenses: Not on file  Food Insecurity:   . Worried About [employer] of  Food in the Last Year: Not on file  . Ran Out of Food in the Last Year: Not on file  Transportation Needs:   . Lack of Transportation (Medical): Not on file  . Lack of Transportation (Non-Medical): Not on file  Physical Activity:   . Days of Exercise per Week: Not on file  . Minutes of Exercise per Session: Not on file  Stress:   . Feeling of Stress : Not on file  Social Connections:   . Frequency of Communication with Friends and Family: Not on file  . Frequency of Social Gatherings with Friends and Family: Not on file  . Attends Religious Services: Not on file  . Active Member of Clubs or Organizations: Not on file  . Attends Archivist Meetings: Not on file  . Marital Status: Not on file     Family History:  The patient's family history includes Arrhythmia in his  father; Heart attack in his mother.   ROS:   Please see the history of present illness.    ROS All other systems reviewed and are negative.   PHYSICAL EXAM:   VS:  BP 140/80   Pulse 86   Ht 5\' 8"  (1.727 m)   Wt 247 lb (112 kg)   SpO2 98%   BMI 37.56 kg/m   Physical Exam  GEN: Obese, in no acute distress  Neck: no JVD, carotid bruits, or masses Cardiac:irreg irreg; no murmurs, rubs, or gallops  Respiratory:  clear to auscultation bilaterally, normal work of breathing GI: soft, nontender, nondistended, + BS Ext: without cyanosis, clubbing, or edema, Good distal pulses bilaterally Neuro:  Alert and Oriented x 3 Psych: euthymic mood, full affect  Wt Readings from Last 3 Encounters:  01/05/20 247 lb (112 kg)  12/10/19 252 lb (114.3 kg)  12/08/18 238 lb (108 kg)      Studies/Labs Reviewed:   EKG:  EKG is ordered today.  The ekg ordered today demonstrates  Atrial fibrillation with CVR, no change  Recent Labs: No results found for requested labs within last 8760 hours.   Lipid Panel    Component Value Date/Time   CHOL 155 11/07/2016 0834   TRIG 107 11/07/2016 0834   HDL 34 (L) 11/07/2016 0834   CHOLHDL 4.6 11/07/2016 0834   VLDL 21 03/03/2014 0834   LDLCALC 101 (H) 11/07/2016 0834    Additional studies/ records that were reviewed today include:  02/2014 Echo Study Conclusions  - Procedure narrative: Transthoracic echocardiography for left   ventricular function evaluation, for right ventricular function   evaluation, and for assessment of valvular function. Image   quality was suboptimal, with poor valvular and endocardial   visualization. Patient was tachycardic throughout exam. - Left ventricle: There was mild concentric hypertrophy. Systolic   function was normal. The estimated ejection fraction was in the   range of 50% to 55%. Images were inadequate for LV wall motion   assessment. The study was not technically sufficient to allow   evaluation of LV  diastolic dysfunction due to atrial flutter. - Mitral valve: Mildly calcified annulus. Mildly thickened leaflets   . There was trivial regurgitation. - Left atrium: The atrium was mildly dilated. - Tricuspid valve: There was trivial regurgitation.     ASSESSMENT:    1. Atrial fibrillation, unspecified type ([place])   2. Essential hypertension   3. Other hyperlipidemia   4. Type 2 diabetes mellitus without complication, without long-term current use of insulin (HCC)   5. Obesity (BMI  30-39.9)      PLAN:  In order of problems listed above:  Atrial flutter/atrial fibrillation CHA2DS2-VASc equals 3 on Eliquis. Labs reviewed from PCP 11/20/19 Hgb normal, Crt 0.93. no bleeding problems on eliquis  Essential hypertension BP a little on the high side but pretty well controlled. 2 gram sodium diet and monitor closer at home  Hyperlipidemia LDL 134-Zetia added to lipitor by PCP and repeat labs to be done in Feb  DM type II A1C 6.4  Obesity-. Exercise and weight loss recommended.   Medication Adjustments/Labs and Tests Ordered: Current medicines are reviewed at length with the patient today.  Concerns regarding medicines are outlined above.  Medication changes, Labs and Tests ordered today are listed in the Patient Instructions below. Patient Instructions   Medication Instructions:  Your physician recommends that you continue on your current medications as directed. Please refer to the Current Medication list given to you today.  *If you need a refill on your cardiac medications before your next appointment, please call your pharmacy*   Lab Work: None If you have labs (blood work) drawn today and your tests are completely normal, you will receive your results only by: Marland Kitchen MyChart Message (if you have MyChart) OR . A paper copy in the mail If you have any lab test that is abnormal or we need to change your treatment, we will call you to review the  results.   Testing/Procedures: None   Follow-Up: At [facility], you and your health needs are our priority.  As part of our continuing mission to provide you with exceptional heart care, we have created designated Provider Care Teams.  These Care Teams include your primary Cardiologist (physician) and Advanced Practice Providers (APPs -  Physician Assistants and Nurse Practitioners) who all work together to provide you with the care you need, when you need it.  We recommend signing up for the patient portal called "MyChart".  Sign up information is provided on this After Visit Summary.  MyChart is used to connect with patients for Virtual Visits (Telemedicine).  Patients are able to view lab/test results, encounter notes, upcoming appointments, etc.  Non-urgent messages can be sent to your provider as well.   To learn more about what you can do with MyChart, go to [email or web address].    Your next appointment:   6 month(s)  The format for your next appointment:   In Person  Provider:   You may see Carlyle Dolly, MD or one of the following Advanced Practice Providers on your designated Care Team:    Bernerd Pho, PA-C   Ermalinda Barrios, [place]     Other Instructions Your provider recommends that you maintain 150 minutes per week of moderate aerobic activity.  Two Gram Sodium Diet 2000 mg  What is Sodium? Sodium is a mineral found naturally in many foods. The most significant source of sodium in the diet is table salt, which is about 40% sodium.  Processed, convenience, and preserved foods also contain a large amount of sodium.  The body needs only 500 mg of sodium daily to function,  A normal diet provides more than enough sodium even if you do not use salt.  Why Limit Sodium? A build up of sodium in the body can cause thirst, increased blood pressure, shortness of breath, and water retention.  Decreasing sodium in the diet can reduce edema and risk of heart attack or  stroke associated with high blood pressure.  Keep in mind that there are many other  factors involved in these health problems.  Heredity, obesity, lack of exercise, cigarette smoking, stress and what you eat all play a role.  General Guidelines:  Do not add salt at the table or in cooking.  One teaspoon of salt contains over 2 grams of sodium.  Read food labels  Avoid processed and convenience foods  Ask your dietitian before eating any foods not dicussed in the menu planning guidelines  Consult your physician if you wish to use a salt substitute or a sodium containing medication such as antacids.  Limit milk and milk products to 16 oz (2 cups) per day.  Shopping Hints:  READ LABELS!! "Dietetic" does not necessarily mean low sodium.  Salt and other sodium ingredients are often added to foods during processing.   Menu Planning Guidelines Food Group Choose More Often Avoid  Beverages (see also the milk group All fruit juices, low-sodium, salt-free vegetables juices, low-sodium carbonated beverages Regular vegetable or tomato juices, commercially softened water used for drinking or cooking  Breads and Cereals Enriched white, wheat, rye and pumpernickel bread, hard rolls and dinner rolls; muffins, cornbread and waffles; most dry cereals, cooked cereal without added salt; unsalted crackers and breadsticks; low sodium or homemade bread crumbs Bread, rolls and crackers with salted tops; quick breads; instant hot cereals; pancakes; commercial bread stuffing; self-rising flower and biscuit mixes; regular bread crumbs or cracker crumbs  Desserts and Sweets Desserts and sweets mad with mild should be within allowance Instant pudding mixes and cake mixes  Fats Butter or margarine; vegetable oils; unsalted salad dressings, regular salad dressings limited to 1 Tbs; light, sour and heavy cream Regular salad dressings containing bacon fat, bacon bits, and salt pork; snack dips made with instant soup mixes  or processed cheese; salted nuts  Fruits Most fresh, frozen and canned fruits Fruits processed with salt or sodium-containing ingredient (some dried fruits are processed with sodium sulfites        Vegetables Fresh, frozen vegetables and low- sodium canned vegetables Regular canned vegetables, sauerkraut, pickled vegetables, and others prepared in brine; frozen vegetables in sauces; vegetables seasoned with ham, bacon or salt pork  Condiments, Sauces, Miscellaneous  Salt substitute with physician's approval; pepper, herbs, spices; vinegar, lemon or lime juice; hot pepper sauce; garlic powder, onion powder, low sodium soy sauce (1 Tbs.); low sodium condiments (ketchup, chili sauce, mustard) in limited amounts (1 tsp.) fresh ground horseradish; unsalted tortilla chips, pretzels, potato chips, popcorn, salsa (1/4 cup) Any seasoning made with salt including garlic salt, celery salt, onion salt, and seasoned salt; sea salt, rock salt, kosher salt; meat tenderizers; monosodium glutamate; mustard, regular soy sauce, barbecue, sauce, chili sauce, teriyaki sauce, steak sauce, Worcestershire sauce, and most flavored vinegars; canned gravy and mixes; regular condiments; salted snack foods, olives, picles, relish, horseradish sauce, catsup   Food preparation: Try these seasonings Meats:    Pork Sage, onion Serve with applesauce  Chicken Poultry seasoning, thyme, parsley Serve with cranberry sauce  Lamb Curry powder, rosemary, garlic, thyme Serve with mint sauce or jelly  Veal Marjoram, basil Serve with current jelly, cranberry sauce  Beef Pepper, bay leaf Serve with dry mustard, unsalted chive butter  Fish [place] leaf, dill Serve with unsalted lemon butter, unsalted parsley butter  Vegetables:    Asparagus Lemon juice   Broccoli Lemon juice   Carrots Mustard dressing parsley, mint, nutmeg, glazed with unsalted butter and sugar   Green beans Marjoram, lemon juice, nutmeg,dill seed   Tomatoes Basil,  marjoram, onion  Spice /blend for "Salt Shaker" 4 tsp ground thyme 1 tsp ground sage 3 tsp ground rosemary 4 tsp ground marjoram   Test your knowledge 1. A product that says "Salt Free" may still contain sodium. True or False 2. Garlic Powder and Hot Pepper Sauce an be used as alternative seasonings.True or False 3. Processed foods have more sodium than fresh foods.  True or False 4. Canned Vegetables have less sodium than froze True or False  WAYS TO DECREASE YOUR SODIUM INTAKE 1. Avoid the use of added salt in cooking and at the table.  Table salt (and other prepared seasonings which contain salt) is probably one of the greatest sources of sodium in the diet.  Unsalted foods can gain flavor from the sweet, sour, and butter taste sensations of herbs and spices.  Instead of using salt for seasoning, try the following seasonings with the foods listed.  Remember: how you use them to enhance natural food flavors is limited only by your creativity... Allspice-Meat, fish, eggs, fruit, peas, red and yellow vegetables Almond Extract-Fruit baked goods Anise Seed-Sweet breads, fruit, carrots, beets, cottage cheese, cookies (tastes like licorice) Basil-Meat, fish, eggs, vegetables, rice, vegetables salads, soups, sauces Bay Leaf-Meat, fish, stews, poultry Burnet-Salad, vegetables (cucumber-like flavor) Caraway Seed-Bread, cookies, cottage cheese, meat, vegetables, cheese, rice Cardamon-Baked goods, fruit, soups Celery Powder or seed-Salads, salad dressings, sauces, meatloaf, soup, bread.Do not use  celery salt Chervil-Meats, salads, fish, eggs, vegetables, cottage cheese (parsley-like flavor) Chili Power-Meatloaf, chicken cheese, corn, eggplant, egg dishes Chives-Salads cottage cheese, egg dishes, soups, vegetables, sauces Cilantro-Salsa, casseroles Cinnamon-Baked goods, fruit, pork, lamb, chicken, carrots Cloves-Fruit, baked goods, fish, pot roast, green beans, beets, carrots Coriander-Pastry,  cookies, meat, salads, cheese (lemon-orange flavor) Cumin-Meatloaf, fish,cheese, eggs, cabbage,fruit pie (caraway flavor) [employer], fruit, eggs, fish, poultry, cottage cheese, vegetables Dill Seed-Meat, cottage cheese, poultry, vegetables, fish, salads, bread Fennel Seed-Bread, cookies, apples, pork, eggs, fish, beets, cabbage, cheese, Licorice-like flavor Garlic-(buds or powder) Salads, meat, poultry, fish, bread, butter, vegetables, potatoes.Do not  use garlic salt Ginger-Fruit, vegetables, baked goods, meat, fish, poultry Horseradish Root-Meet, vegetables, butter Lemon Juice or Extract-Vegetables, fruit, tea, baked goods, fish salads Mace-Baked goods fruit, vegetables, fish, poultry (taste like nutmeg) Maple Extract-Syrups Marjoram-Meat, chicken, fish, vegetables, breads, green salads (taste like Sage) Mint-Tea, lamb, sherbet, vegetables, desserts, carrots, cabbage Mustard, Dry or Seed-Cheese, eggs, meats, vegetables, poultry Nutmeg-Baked goods, fruit, chicken, eggs, vegetables, desserts Onion Powder-Meat, fish, poultry, vegetables, cheese, eggs, bread, rice salads (Do not use   Onion salt) Orange Extract-Desserts, baked goods Oregano-Pasta, eggs, cheese, onions, pork, lamb, fish, chicken, vegetables, green salads Paprika-Meat, fish, poultry, eggs, cheese, vegetables Parsley Flakes-Butter, vegetables, meat fish, poultry, eggs, bread, salads (certain forms may   Contain sodium Pepper-Meat fish, poultry, vegetables, eggs Peppermint Extract-Desserts, baked goods Poppy Seed-Eggs, bread, cheese, fruit dressings, baked goods, noodles, vegetables, cottage  [employer], poultry, meat, fish, cauliflower, turnips,eggs bread Saffron-Rice, bread, veal, chicken, fish, eggs Sage-Meat, fish, poultry, onions, eggplant, tomateos, pork, stews Savory-Eggs, salads, poultry, meat, rice, vegetables, soups, pork Tarragon-Meat, poultry, fish, eggs, butter,  vegetables (licorice-like flavor)  Thyme-Meat, poultry, fish, eggs, vegetables, (clover-like flavor), sauces, soups Tumeric-Salads, butter, eggs, fish, rice, vegetables (saffron-like flavor) Vanilla Extract-Baked goods, candy Vinegar-Salads, vegetables, meat marinades Walnut Extract-baked goods, candy  2. Choose your Foods Wisely   The following is a list of foods to avoid which are high in sodium:  Meats-Avoid all smoked, canned, salt cured, dried and kosher meat and fish as well as Anchovies   Lox Berniece Salines  Luncheon meats:Bologna, Liverwurst, Pastrami Canned meat or fish  Marinated herring Caviar    Pepperoni Corned Beef   Pizza Dried chipped beef  Salami Frozen breaded fish or meat Salt pork Frankfurters or hot dogs  Sardines Gefilte fish   Sausage Ham (boiled ham, Proscuitto Smoked butt    spiced ham)   Spam      TV Dinners Vegetables Canned vegetables (Regular) Relish Canned mushrooms  Sauerkraut Olives    Tomato juice Pickles  Bakery and Dessert Products Canned puddings  Cream pies Cheesecake   Decorated cakes Cookies  Beverages/Juices Tomato juice, regular  Gatorade   V-8 vegetable juice, regular  Breads and Cereals Biscuit mixes   Salted potato chips, corn chips, pretzels Bread stuffing mixes  Salted crackers and rolls Pancake and waffle mixes Self-rising flour  Seasonings Accent    Meat sauces Barbecue sauce  Meat tenderizer Catsup    Monosodium glutamate (MSG) Celery salt   Onion salt Chili sauce   Prepared mustard Garlic salt   Salt, seasoned salt, sea salt Gravy mixes   Soy sauce Horseradish   Steak sauce Ketchup   Tartar sauce Lite salt    Teriyaki sauce Marinade mixes   Worcestershire sauce  Others Baking powder   Cocoa and cocoa mixes Baking soda   Commercial casserole mixes Candy-caramels, chocolate  Dehydrated soups    Bars, fudge,nougats  Instant rice and pasta mixes Canned broth or soup  Maraschino cherries Cheese, aged and processed  cheese and cheese spreads  Learning Assessment Quiz  Indicated T (for True) or F (for False) for each of the following statements:  1. _____ Fresh fruits and vegetables and unprocessed grains are generally low in sodium 2. _____ Water may contain a considerable amount of sodium, depending on the source 3. _____ You can always tell if a food is high in sodium by tasting it 4. _____ Certain laxatives my be high in sodium and should be avoided unless prescribed   by a physician or pharmacist 5. _____ Salt substitutes may be used freely by anyone on a sodium restricted diet 6. _____ Sodium is present in table salt, food additives and as a natural component of   most foods 7. _____ Table salt is approximately 90% sodium 8. _____ Limiting sodium intake may help prevent excess fluid accumulation in the body 9. _____ On a sodium-restricted diet, seasonings such as bouillon soy sauce, and    cooking wine should be used in place of table salt 10. _____ On an ingredient list, a product which lists monosodium glutamate as the first   ingredient is an appropriate food to include on a low sodium diet  Circle the best answer(s) to the following statements (Hint: there may be more than one correct answer)  11. On a low-sodium diet, some acceptable snack items are:    A. Olives  F. Bean dip   K. Grapefruit juice    B. Salted Pretzels G. Commercial Popcorn   L. Canned peaches    C. Carrot Sticks  H. Bouillon   M. Unsalted nuts   D. [place] fries  I. Peanut butter crackers N. Salami   E. Sweet pickles J. Tomato Juice   O. Pizza  12.  Seasonings that may be used freely on a reduced - sodium diet include   A. Lemon wedges F.Monosodium glutamate K. Celery seed    B.Soysauce   G. Pepper   L. Mustard powder   C. Sea salt  H. Cooking wine  M.  Onion flakes   D. Vinegar  E. Prepared horseradish N. Salsa   E. Sage   J. Worcestershire sauce  O. Chutney      Signed, Ermalinda Barrios, PA-C  01/05/2020  8:16 AM    [facility] [address], [place], [place]  [address] Phone: [phone]; Fax: [phone]

## 2019-12-28 NOTE — Therapy (Addendum)
[place], [place], [address] Phone: [phone]   Fax:  [phone]  Occupational Therapy Treatment  Patient Details  Name: Arthur Le MRN: [id number] Date of Birth: [birth year] Referring Provider (OT): Arther Abbott, MD   Encounter Date: 12/28/2019   OT End of Session - 12/28/19 1822    Visit Number 4    Number of Visits 9    Date for OT Re-Evaluation 01/15/20    Authorization Type [employer], no visit limit, $40 copay    Authorization Time Period [employer] approved 8 visits (12/18/19-01/15/20)    Authorization - Visit Number 3   Authorization - Number of Visits 8    Progress Note Due on Visit 10    OT Start Time 1602    OT Stop Time 1640    OT Time Calculation (min) 38 min    Activity Tolerance Patient tolerated treatment well    Behavior During Therapy [facility] for tasks assessed/performed           Past Medical History:  Diagnosis Date  . Atrial flutter ([place])   . Hyperlipidemia     Past Surgical History:  Procedure Laterality Date  . TONSILLECTOMY AND ADENOIDECTOMY      There were no vitals filed for this visit.   Subjective Assessment - 12/28/19 1822    Subjective  S: It's the worst in the morning when I wake up. It's so stiff. As I get going and the doy continues it gets better. I think the shot is wearing off.    Currently in Pain? No/denies              [facility] OT Assessment - 12/28/19 1605      Assessment   Medical Diagnosis Right shoulder adhesive capsulitis      Precautions   Precautions None                    OT Treatments/Exercises (OP) - 12/28/19 1606      Exercises   Exercises Shoulder      Shoulder Exercises: Standing   Horizontal ABduction AROM;10 reps    External Rotation AROM;10 reps    Internal Rotation AROM;10 reps    Flexion AROM;10 reps    ABduction AROM;10 reps    Other Standing Exercises Y arms lift off; 10X    Other Standing  Exercises        Shoulder Exercises: ROM/Strengthening   UBE (Upper Arm Bike) Level 3 3' reverse only    pace: 10-12   Proximal Shoulder Strengthening, Seated 10X A/ROM no rest breaks    Ball on Wall 1' flexion 1' abduction      Manual Therapy   Manual Therapy Myofascial release    Manual therapy comments manual therapy completed prior to exercises.      Myofascial Release Myofascial release and manual stretching completed to right upper arm, trapezius region and scapularis region to decrease fascial restrictions and increase ROM in a pain free zone.                     OT Short Term Goals - 12/21/19 1742      OT SHORT TERM GOAL #1   Title Patient will be educated and independent in a HEP in order to promote increased functional use of his RUE as dominant.    Time 4    Period Weeks    Status On-going    Target Date 01/15/20  OT SHORT TERM GOAL #2   Title Patient will decrease fascial restrictions of the RUE trapezius and scapularis regions to at least a min-mod level to improve mobility in a pain free zone.    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    Time 4    Period Weeks    Status On-going      OT SHORT TERM GOAL #4   Title Patient will improve RUE to WNL to improve the functional use of his dominant arm in overhead reaching and other daily tasks.    Time 4    Period Weeks    Status On-going      OT SHORT TERM GOAL #5   Title Patient will report a decrease in pain level of about 1/10 in the RUE while using his right hand to push himself up from a chair in order to promote ease with functional mobility and positional changes.    Time 4    Period Weeks    Status On-going                    Plan - 12/28/19 1823     Clinical Impression Statement A: Patient demonstrates full passive ROM this date with external rotation slightly less. Progressed to proximal shoulder strengthening using weighted ball on door. Muscle fatigue noted towards end of session. Manual techniques completed to address fascial restrictions. VC provided for form and technique.    Body Structure / Function / Physical Skills Endurance;ROM;Fascial restriction;Muscle spasms;Strength;Pain    Plan P: Add scapular strengthening.    Consulted and Agree with Plan of Care Patient           Patient will benefit from skilled therapeutic intervention in order to improve the following deficits and impairments:   Body Structure / Function / Physical Skills: Endurance, ROM, Fascial restriction, Muscle spasms, Strength, Pain       Visit Diagnosis: Other symptoms and signs involving the musculoskeletal system  Acute pain of right shoulder  Stiffness of right shoulder, not elsewhere classified    Problem List Patient Active Problem List   Diagnosis Date Noted  . Atrial flutter ([place]) 02/19/2014  . Cardiac risk counseling 02/17/2014  . Hyperlipemia 02/16/2014  . Obesity 02/16/2014   Ailene Ravel, OTR/L,CBIS  [phone]  12/28/2019, 6:25 PM  [facility] [address] [place], [place], [address] Phone: [phone]   Fax:  [phone]  Name: Arthur Le MRN: [id number] Date of Birth: 08-25-1950

## 2019-12-30 ENCOUNTER — Other Ambulatory Visit: Payer: Self-pay

## 2019-12-30 ENCOUNTER — Encounter (HOSPITAL_COMMUNITY): Payer: Self-pay

## 2019-12-30 ENCOUNTER — Ambulatory Visit (HOSPITAL_COMMUNITY): Payer: Medicare HMO

## 2019-12-30 DIAGNOSIS — M25611 Stiffness of right shoulder, not elsewhere classified: Secondary | ICD-10-CM | POA: Diagnosis not present

## 2019-12-30 DIAGNOSIS — E782 Mixed hyperlipidemia: Secondary | ICD-10-CM | POA: Diagnosis not present

## 2019-12-30 DIAGNOSIS — R29898 Other symptoms and signs involving the musculoskeletal system: Secondary | ICD-10-CM

## 2019-12-30 DIAGNOSIS — K219 Gastro-esophageal reflux disease without esophagitis: Secondary | ICD-10-CM | POA: Diagnosis not present

## 2019-12-30 DIAGNOSIS — M25511 Pain in right shoulder: Secondary | ICD-10-CM | POA: Diagnosis not present

## 2019-12-30 DIAGNOSIS — I10 Essential (primary) hypertension: Secondary | ICD-10-CM | POA: Diagnosis not present

## 2019-12-30 DIAGNOSIS — I4891 Unspecified atrial fibrillation: Secondary | ICD-10-CM | POA: Diagnosis not present

## 2019-12-30 DIAGNOSIS — C4362 Malignant melanoma of left upper limb, including shoulder: Secondary | ICD-10-CM | POA: Diagnosis not present

## 2019-12-30 DIAGNOSIS — E1169 Type 2 diabetes mellitus with other specified complication: Secondary | ICD-10-CM | POA: Diagnosis not present

## 2019-12-30 DIAGNOSIS — E041 Nontoxic single thyroid nodule: Secondary | ICD-10-CM | POA: Diagnosis not present

## 2019-12-30 DIAGNOSIS — I4892 Unspecified atrial flutter: Secondary | ICD-10-CM | POA: Diagnosis not present

## 2019-12-30 NOTE — Therapy (Addendum)
[place], [place], [address] Phone: [phone]   Fax:  [phone]  Occupational Therapy Treatment  Patient Details  Name: Arthur Le MRN: [id number] Date of Birth: 10-15-1950 Referring Provider (OT): Arther Abbott, MD   Encounter Date: 12/30/2019   OT End of Session - 12/30/19 0836    Visit Number 5    Number of Visits 9    Date for OT Re-Evaluation 01/15/20    Authorization Type [employer] HMO, no visit limit, $40 copay    Authorization Time Period [employer] approved 8 visits (12/18/19-01/15/20)    Authorization - Visit Number 4   Authorization - Number of Visits 8    Progress Note Due on Visit 10    OT Start Time 0817    OT Stop Time 0855    OT Time Calculation (min) 38 min    Activity Tolerance Patient tolerated treatment well    Behavior During Therapy [facility] for tasks assessed/performed           Past Medical History:  Diagnosis Date  . Atrial flutter ([place])   . Hyperlipidemia     Past Surgical History:  Procedure Laterality Date  . TONSILLECTOMY AND ADENOIDECTOMY      There were no vitals filed for this visit.   Subjective Assessment - 12/30/19 0831    Subjective  S: It's feeling good. The warm water is really helping the most.    Currently in Pain? No/denies              [facility] OT Assessment - 12/30/19 0831      Assessment   Medical Diagnosis Right shoulder adhesive capsulitis      Precautions   Precautions None                    OT Treatments/Exercises (OP) - 12/30/19 0831      Exercises   Exercises Shoulder      Shoulder Exercises: Supine   Protraction PROM;5 reps    Horizontal ABduction PROM;5 reps    External Rotation PROM;5 reps    Internal Rotation PROM;5 reps    Flexion PROM;5 reps    ABduction PROM;12 reps      Shoulder Exercises: Standing   Horizontal ABduction AROM;12 reps    External Rotation AROM;Theraband;12 reps   towel   Theraband  Level (Shoulder External Rotation) Level 3 (Green)    Internal Rotation AROM;12 reps    Flexion AROM;12 reps    ABduction AROM;12 reps    Extension Theraband;12 reps    Theraband Level (Shoulder Extension) Level 3 (Green)    Row [employer] reps    Theraband Level (Shoulder Row) Level 2 (Red)    Retraction Theraband;12 reps    Theraband Level (Shoulder Retraction) Level 3 (Green)    Other Standing Exercises Y arms lift off; 10X      Shoulder Exercises: Therapy Ball   Other Therapy Ball Exercises Green therapy ball; 12X; chest press, circles, flexion      Shoulder Exercises: ROM/Strengthening   UBE (Upper Arm Bike) Level 4 3' reverse only    pace: 14.0   X to V Arms A/ROM 10X    Proximal Shoulder Strengthening, Seated 10X A/ROM no rest breaks    Ball on Wall 1' flexion 1' abduction      Manual Therapy   Manual Therapy Myofascial release    Manual therapy comments manual therapy completed prior to exercises.  Myofascial Release Myofascial release and manual stretching completed to right upper arm, trapezius region and scapularis region to decrease fascial restrictions and increase ROM in a pain free zone.                     OT Short Term Goals - 12/21/19 1742      OT SHORT TERM GOAL #1   Title Patient will be educated and independent in a HEP in order to promote increased functional use of his RUE as dominant.    Time 4    Period Weeks    Status On-going    Target Date 01/15/20      OT SHORT TERM GOAL #2   Title Patient will decrease fascial restrictions of the RUE trapezius and scapularis regions to at least a min-mod level to improve mobility in a pain free zone.    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    Time 4    Period  Weeks    Status On-going      OT SHORT TERM GOAL #4   Title Patient will improve RUE to WNL to improve the functional use of his dominant arm in overhead reaching and other daily tasks.    Time 4    Period Weeks    Status On-going      OT SHORT TERM GOAL #5   Title Patient will report a decrease in pain level of about 1/10 in the RUE while using his right hand to push himself up from a chair in order to promote ease with functional mobility and positional changes.    Time 4    Period Weeks    Status On-going                    Plan - 12/30/19 0853    Clinical Impression Statement A: Continued to work on shoulder stability this session. Continues to experience muscle fatigue during shoulder endurance exercises. Overall progressing in therapy as he was able to complete resistive er without decreasing the tension for all 12 repetitions. VC for form and technique provided. Fascial restrictions are decreasing while none palpated in the right upper trapezius region. Min fascial restrictions continue to be present in the right medial and anterior upper arm region.    Body Structure / Function / Physical Skills Endurance;ROM;Fascial restriction;Muscle spasms;Strength;Pain    Plan P: Try sidelying strengthening. Could probably tolerate a 2# weight. Continue to work on shoulder stability and strength.    Consulted and Agree with Plan of Care Patient           Patient will benefit from skilled therapeutic intervention in order to improve the following deficits and impairments:   Body Structure / Function / Physical Skills: Endurance, ROM, Fascial restriction, Muscle spasms, Strength, Pain       Visit Diagnosis: Other symptoms and signs involving the musculoskeletal system  Stiffness of right shoulder, not elsewhere classified  Acute pain of right shoulder    Problem List Patient Active Problem List   Diagnosis Date Noted  . Atrial flutter ([place]) 02/19/2014  . Cardiac risk  counseling 02/17/2014  . Hyperlipemia 02/16/2014  . Obesity 02/16/2014   Ailene Ravel,  OTR/L,CBIS  [phone]  12/30/2019, 9:17 AM  [facility] [address] [place], [place], [address] Phone: [phone]   Fax:  [phone]  Name: CAESON FILIPPI MRN: [id number] Date of Birth: 04/04/1950

## 2020-01-05 ENCOUNTER — Encounter: Payer: Self-pay | Admitting: Physician Assistant

## 2020-01-05 ENCOUNTER — Other Ambulatory Visit: Payer: Self-pay

## 2020-01-05 ENCOUNTER — Ambulatory Visit: Payer: Medicare HMO | Admitting: Physician Assistant

## 2020-01-05 VITALS — BP 140/80 | HR 86 | Ht 68.0 in | Wt 247.0 lb

## 2020-01-05 DIAGNOSIS — E7849 Other hyperlipidemia: Secondary | ICD-10-CM

## 2020-01-05 DIAGNOSIS — E119 Type 2 diabetes mellitus without complications: Secondary | ICD-10-CM | POA: Diagnosis not present

## 2020-01-05 DIAGNOSIS — I10 Essential (primary) hypertension: Secondary | ICD-10-CM

## 2020-01-05 DIAGNOSIS — I4891 Unspecified atrial fibrillation: Secondary | ICD-10-CM

## 2020-01-05 DIAGNOSIS — E669 Obesity, unspecified: Secondary | ICD-10-CM

## 2020-01-05 NOTE — Patient Instructions (Signed)
Medication Instructions:  Your physician recommends that you continue on your current medications as directed. Please refer to the Current Medication list given to you today.  *If you need a refill on your cardiac medications before your next appointment, please call your pharmacy*   Lab Work: None If you have labs (blood work) drawn today and your tests are completely normal, you will receive your results only by:  [place] (if you have MyChart) OR  A paper copy in the mail If you have any lab test that is abnormal or we need to change your treatment, we will call you to review the results.   Testing/Procedures: None   Follow-Up: At [facility], you and your health needs are our priority.  As part of our continuing mission to provide you with exceptional heart care, we have created designated Provider Care Teams.  These Care Teams include your primary Cardiologist (physician) and Advanced Practice Providers (APPs -  Physician Assistants and Nurse Practitioners) who all work together to provide you with the care you need, when you need it.  We recommend signing up for the patient portal called "MyChart".  Sign up information is provided on this After Visit Summary.  MyChart is used to connect with patients for Virtual Visits (Telemedicine).  Patients are able to view lab/test results, encounter notes, upcoming appointments, etc.  Non-urgent messages can be sent to your provider as well.   To learn more about what you can do with MyChart, go to [email or web address].    Your next appointment:   6 month(s)  The format for your next appointment:   In Person  Provider:   You may see Carlyle Dolly, MD or one of the following Advanced Practice Providers on your designated Care Team:    Bernerd Pho, PA-C   Ermalinda Barrios, [place]     Other Instructions Your provider recommends that you maintain 150 minutes per week of moderate aerobic activity.  Two Gram Sodium  Diet 2000 mg  What is Sodium? Sodium is a mineral found naturally in many foods. The most significant source of sodium in the diet is table salt, which is about 40% sodium.  Processed, convenience, and preserved foods also contain a large amount of sodium.  The body needs only 500 mg of sodium daily to function,  A normal diet provides more than enough sodium even if you do not use salt.  Why Limit Sodium? A build up of sodium in the body can cause thirst, increased blood pressure, shortness of breath, and water retention.  Decreasing sodium in the diet can reduce edema and risk of heart attack or stroke associated with high blood pressure.  Keep in mind that there are many other factors involved in these health problems.  Heredity, obesity, lack of exercise, cigarette smoking, stress and what you eat all play a role.  General Guidelines:  Do not add salt at the table or in cooking.  One teaspoon of salt contains over 2 grams of sodium.  Read food labels  Avoid processed and convenience foods  Ask your dietitian before eating any foods not dicussed in the menu planning guidelines  Consult your physician if you wish to use a salt substitute or a sodium containing medication such as antacids.  Limit milk and milk products to 16 oz (2 cups) per day.  Shopping Hints:  READ LABELS!! "Dietetic" does not necessarily mean low sodium.  Salt and other sodium ingredients are often added to foods during processing.  Menu Planning Guidelines Food Group Choose More Often Avoid  Beverages (see also the milk group All fruit juices, low-sodium, salt-free vegetables juices, low-sodium carbonated beverages Regular vegetable or tomato juices, commercially softened water used for drinking or cooking  Breads and Cereals Enriched white, wheat, rye and pumpernickel bread, hard rolls and dinner rolls; muffins, cornbread and waffles; most dry cereals, cooked cereal without added salt; unsalted crackers and  breadsticks; low sodium or homemade bread crumbs Bread, rolls and crackers with salted tops; quick breads; instant hot cereals; pancakes; commercial bread stuffing; self-rising flower and biscuit mixes; regular bread crumbs or cracker crumbs  Desserts and Sweets Desserts and sweets mad with mild should be within allowance Instant pudding mixes and cake mixes  Fats Butter or margarine; vegetable oils; unsalted salad dressings, regular salad dressings limited to 1 Tbs; light, sour and heavy cream Regular salad dressings containing bacon fat, bacon bits, and salt pork; snack dips made with instant soup mixes or processed cheese; salted nuts  Fruits Most fresh, frozen and canned fruits Fruits processed with salt or sodium-containing ingredient (some dried fruits are processed with sodium sulfites        Vegetables Fresh, frozen vegetables and low- sodium canned vegetables Regular canned vegetables, sauerkraut, pickled vegetables, and others prepared in brine; frozen vegetables in sauces; vegetables seasoned with ham, bacon or salt pork  Condiments, Sauces, Miscellaneous  Salt substitute with physician's approval; pepper, herbs, spices; vinegar, lemon or lime juice; hot pepper sauce; garlic powder, onion powder, low sodium soy sauce (1 Tbs.); low sodium condiments (ketchup, chili sauce, mustard) in limited amounts (1 tsp.) fresh ground horseradish; unsalted tortilla chips, pretzels, potato chips, popcorn, salsa (1/4 cup) Any seasoning made with salt including garlic salt, celery salt, onion salt, and seasoned salt; sea salt, rock salt, kosher salt; meat tenderizers; monosodium glutamate; mustard, regular soy sauce, barbecue, sauce, chili sauce, teriyaki sauce, steak sauce, Worcestershire sauce, and most flavored vinegars; canned gravy and mixes; regular condiments; salted snack foods, olives, picles, relish, horseradish sauce, catsup   Food preparation: Try these seasonings Meats:    Pork Sage, onion  Serve with applesauce  Chicken Poultry seasoning, thyme, parsley Serve with cranberry sauce  Lamb Curry powder, rosemary, garlic, thyme Serve with mint sauce or jelly  Veal Marjoram, basil Serve with current jelly, cranberry sauce  Beef Pepper, bay leaf Serve with dry mustard, unsalted chive butter  Fish [place] leaf, dill Serve with unsalted lemon butter, unsalted parsley butter  Vegetables:    Asparagus Lemon juice   Broccoli Lemon juice   Carrots Mustard dressing parsley, mint, nutmeg, glazed with unsalted butter and sugar   Green beans Marjoram, lemon juice, nutmeg,dill seed   Tomatoes Basil, marjoram, onion   Spice /blend for [employer]" 4 tsp ground thyme 1 tsp ground sage 3 tsp ground rosemary 4 tsp ground marjoram   Test your knowledge 1. A product that says "Salt Free" may still contain sodium. True or False 2. Garlic Powder and Hot Pepper Sauce an be used as alternative seasonings.True or False 3. Processed foods have more sodium than fresh foods.  True or False 4. Canned Vegetables have less sodium than froze True or False  WAYS TO DECREASE YOUR SODIUM INTAKE 1. Avoid the use of added salt in cooking and at the table.  Table salt (and other prepared seasonings which contain salt) is probably one of the greatest sources of sodium in the diet.  Unsalted foods can gain flavor from the sweet, sour, and butter taste  sensations of herbs and spices.  Instead of using salt for seasoning, try the following seasonings with the foods listed.  Remember: how you use them to enhance natural food flavors is limited only by your creativity... Allspice-Meat, fish, eggs, fruit, peas, red and yellow vegetables Almond Extract-Fruit baked goods Anise Seed-Sweet breads, fruit, carrots, beets, cottage cheese, cookies (tastes like licorice) Basil-Meat, fish, eggs, vegetables, rice, vegetables salads, soups, sauces Bay Leaf-Meat, fish, stews, poultry Burnet-Salad, vegetables (cucumber-like  flavor) Caraway Seed-Bread, cookies, cottage cheese, meat, vegetables, cheese, rice Cardamon-Baked goods, fruit, soups Celery Powder or seed-Salads, salad dressings, sauces, meatloaf, soup, bread.Do not use  celery salt Chervil-Meats, salads, fish, eggs, vegetables, cottage cheese (parsley-like flavor) Chili Power-Meatloaf, chicken cheese, corn, eggplant, egg dishes Chives-Salads cottage cheese, egg dishes, soups, vegetables, sauces Cilantro-Salsa, casseroles Cinnamon-Baked goods, fruit, pork, lamb, chicken, carrots Cloves-Fruit, baked goods, fish, pot roast, green beans, beets, carrots Coriander-Pastry, cookies, meat, salads, cheese (lemon-orange flavor) Cumin-Meatloaf, fish,cheese, eggs, cabbage,fruit pie (caraway flavor) [employer], fruit, eggs, fish, poultry, cottage cheese, vegetables Dill Seed-Meat, cottage cheese, poultry, vegetables, fish, salads, bread Fennel Seed-Bread, cookies, apples, pork, eggs, fish, beets, cabbage, cheese, Licorice-like flavor Garlic-(buds or powder) Salads, meat, poultry, fish, bread, butter, vegetables, potatoes.Do not  use garlic salt Ginger-Fruit, vegetables, baked goods, meat, fish, poultry Horseradish Root-Meet, vegetables, butter Lemon Juice or Extract-Vegetables, fruit, tea, baked goods, fish salads Mace-Baked goods fruit, vegetables, fish, poultry (taste like nutmeg) Maple Extract-Syrups Marjoram-Meat, chicken, fish, vegetables, breads, green salads (taste like Sage) Mint-Tea, lamb, sherbet, vegetables, desserts, carrots, cabbage Mustard, Dry or Seed-Cheese, eggs, meats, vegetables, poultry Nutmeg-Baked goods, fruit, chicken, eggs, vegetables, desserts Onion Powder-Meat, fish, poultry, vegetables, cheese, eggs, bread, rice salads (Do not use   Onion salt) Orange Extract-Desserts, baked goods Oregano-Pasta, eggs, cheese, onions, pork, lamb, fish, chicken, vegetables, green salads Paprika-Meat, fish, poultry, eggs, cheese, vegetables Parsley  Flakes-Butter, vegetables, meat fish, poultry, eggs, bread, salads (certain forms may   Contain sodium Pepper-Meat fish, poultry, vegetables, eggs Peppermint Extract-Desserts, baked goods Poppy Seed-Eggs, bread, cheese, fruit dressings, baked goods, noodles, vegetables, cottage  [employer], poultry, meat, fish, cauliflower, turnips,eggs bread Saffron-Rice, bread, veal, chicken, fish, eggs Sage-Meat, fish, poultry, onions, eggplant, tomateos, pork, stews Savory-Eggs, salads, poultry, meat, rice, vegetables, soups, pork Tarragon-Meat, poultry, fish, eggs, butter, vegetables (licorice-like flavor)  Thyme-Meat, poultry, fish, eggs, vegetables, (clover-like flavor), sauces, soups Tumeric-Salads, butter, eggs, fish, rice, vegetables (saffron-like flavor) Vanilla Extract-Baked goods, candy Vinegar-Salads, vegetables, meat marinades Walnut Extract-baked goods, candy  2. Choose your Foods Wisely   The following is a list of foods to avoid which are high in sodium:  Meats-Avoid all smoked, canned, salt cured, dried and kosher meat and fish as well as Anchovies   [employer] meats:Bologna, Liverwurst, Pastrami Canned meat or fish  Marinated herring Caviar    Pepperoni Corned Beef   Pizza Dried chipped beef  Salami Frozen breaded fish or meat Salt pork Frankfurters or hot dogs  Sardines Gefilte fish   Sausage Ham (boiled ham, Proscuitto Smoked butt    spiced ham)   Spam      TV Dinners Vegetables Canned vegetables (Regular) Relish Canned mushrooms  Sauerkraut Olives    Tomato juice Pickles  Bakery and Dessert Products Canned puddings  Cream pies Cheesecake   Decorated cakes Cookies  Beverages/Juices Tomato juice, regular  Gatorade   V-8 vegetable juice, regular  Breads and Cereals Biscuit mixes   Salted potato chips, corn chips, pretzels Bread stuffing mixes  Salted crackers and  rolls Pancake and waffle mixes Self-rising  flour  Seasonings Accent    Meat sauces Barbecue sauce  Meat tenderizer Catsup    Monosodium glutamate (MSG) Celery salt   Onion salt Chili sauce   Prepared mustard Garlic salt   Salt, seasoned salt, sea salt Gravy mixes   Soy sauce Horseradish   Steak sauce Ketchup   Tartar sauce Lite salt    Teriyaki sauce Marinade mixes   Worcestershire sauce  Others Baking powder   Cocoa and cocoa mixes Baking soda   Commercial casserole mixes Candy-caramels, chocolate  Dehydrated soups    Bars, fudge,nougats  Instant rice and pasta mixes Canned broth or soup  Maraschino cherries Cheese, aged and processed cheese and cheese spreads  Learning Assessment Quiz  Indicated T (for True) or F (for False) for each of the following statements:  1. _____ Fresh fruits and vegetables and unprocessed grains are generally low in sodium 2. _____ Water may contain a considerable amount of sodium, depending on the source 3. _____ You can always tell if a food is high in sodium by tasting it 4. _____ Certain laxatives my be high in sodium and should be avoided unless prescribed   by a physician or pharmacist 5. _____ Salt substitutes may be used freely by anyone on a sodium restricted diet 6. _____ Sodium is present in table salt, food additives and as a natural component of   most foods 7. _____ Table salt is approximately 90% sodium 8. _____ Limiting sodium intake may help prevent excess fluid accumulation in the body 9. _____ On a sodium-restricted diet, seasonings such as bouillon soy sauce, and    cooking wine should be used in place of table salt 10. _____ On an ingredient list, a product which lists monosodium glutamate as the first   ingredient is an appropriate food to include on a low sodium diet  Circle the best answer(s) to the following statements (Hint: there may be more than one correct answer)  11. On a low-sodium diet, some acceptable snack items are:    A. Olives  F. Bean dip   K.  Grapefruit juice    B. Salted Pretzels G. Commercial Popcorn   L. Canned peaches    C. Carrot Sticks  H. Bouillon   M. Unsalted nuts   D. [place] fries  I. Peanut butter crackers N. Salami   E. Sweet pickles J. Tomato Juice   O. Pizza  12.  Seasonings that may be used freely on a reduced - sodium diet include   A. Lemon wedges F.Monosodium glutamate K. Celery seed    B.Soysauce   G. Pepper   L. Mustard powder   C. Sea salt  H. Cooking wine  M. Onion flakes   D. Vinegar  E. Prepared horseradish N. Salsa   E. Sage   J. Worcestershire sauce  O. Chutney

## 2020-01-06 ENCOUNTER — Ambulatory Visit (HOSPITAL_COMMUNITY): Payer: Medicare HMO | Attending: Orthopedic Surgery | Admitting: Occupational Therapy

## 2020-01-06 ENCOUNTER — Encounter (HOSPITAL_COMMUNITY): Payer: Self-pay | Admitting: Occupational Therapy

## 2020-01-06 DIAGNOSIS — M25511 Pain in right shoulder: Secondary | ICD-10-CM | POA: Insufficient documentation

## 2020-01-06 DIAGNOSIS — M25611 Stiffness of right shoulder, not elsewhere classified: Secondary | ICD-10-CM | POA: Diagnosis not present

## 2020-01-06 DIAGNOSIS — R29898 Other symptoms and signs involving the musculoskeletal system: Secondary | ICD-10-CM | POA: Insufficient documentation

## 2020-01-06 NOTE — Therapy (Addendum)
[facility] [place], [place], [address] Phone: [phone]   Fax:  [phone]  Occupational Therapy Treatment  Patient Details  Name: Arthur Le MRN: [id number] Date of Birth: Mar 17, 1950 Referring Provider (OT): Arther Abbott, MD   Encounter Date: 01/06/2020   OT End of Session - 01/06/20 0943    Visit Number 6    Number of Visits 9    Date for OT Re-Evaluation 01/15/20    Authorization Type [employer] HMO, no visit limit, $40 copay    Authorization Time Period [place] approved 8 visits (12/18/19-01/15/20)    Authorization - Visit Number 5    Authorization - Number of Visits 8    Progress Note Due on Visit 10    OT Start Time 0900    OT Stop Time 0940    OT Time Calculation (min) 40 min    Activity Tolerance Patient tolerated treatment well    Behavior During Therapy [facility] for tasks assessed/performed           Past Medical History:  Diagnosis Date  . Atrial flutter ([place])   . Hyperlipidemia     Past Surgical History:  Procedure Laterality Date  . TONSILLECTOMY AND ADENOIDECTOMY      There were no vitals filed for this visit.   Subjective Assessment - 01/06/20 0858    Subjective  S: It's locked up again.    Currently in Pain? No/denies              [facility] OT Assessment - 01/06/20 0857      Assessment   Medical Diagnosis Right shoulder adhesive capsulitis      Precautions   Precautions None                    OT Treatments/Exercises (OP) - 01/06/20 0903      Exercises   Exercises Shoulder      Shoulder Exercises: Supine   Protraction PROM;5 reps;AAROM;10 reps    Horizontal ABduction PROM;5 reps;AAROM;10 reps    External Rotation PROM;5 reps;AROM;10 reps    Internal Rotation PROM;5 reps;AROM;10 reps    Flexion PROM;5 reps;AAROM;10 reps    ABduction PROM;5 reps;AAROM;10 reps      Shoulder Exercises: Standing   Horizontal ABduction AROM;12 reps    External Rotation  AROM;10 reps    Internal Rotation AROM;10 reps    Flexion AAROM;10 reps   2# dowel rod   ABduction AROM;12 reps      Shoulder Exercises: ROM/Strengthening   X to V Arms A/ROM 10X    Proximal Shoulder Strengthening, Seated 10X A/ROM no rest breaks    Ball on Wall 1' flexion 1' abduction      Manual Therapy   Manual Therapy Myofascial release    Manual therapy comments manual therapy completed prior to exercises.      Myofascial Release Myofascial release and manual stretching completed to right upper arm, trapezius region and scapularis region to decrease fascial restrictions and increase ROM in a pain free zone.                     OT Short Term Goals - 12/21/19 1742      OT SHORT TERM GOAL #1   Title Patient will be educated and independent in a HEP in order to promote increased functional use of his RUE as dominant.    Time 4    Period Weeks    Status On-going  Target Date 01/15/20      OT SHORT TERM GOAL #2   Title Patient will decrease fascial restrictions of the RUE trapezius and scapularis regions to at least a min-mod level to improve mobility in a pain free zone.    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    Time 4    Period Weeks    Status On-going      OT SHORT TERM GOAL #4   Title Patient will improve RUE to WNL to improve the functional use of his dominant arm in overhead reaching and other daily tasks.    Time 4    Period Weeks    Status On-going      OT SHORT TERM GOAL #5   Title Patient will report a decrease in pain level of about 1/10 in the RUE while using his right hand to push himself up from a chair in order to promote ease with functional mobility and positional changes.    Time 4    Period Weeks    Status  On-going                    Plan - 01/06/20 9211    Clinical Impression Statement A: Pt reporting decreased mobility and increased discomfort in the arm, A/ROM in standing is lexx than 50%. Continued with manual techniques at beginning of session to address fascial restrictions. Completed passive stretching with ROM WFL, pt completing AA/ROM in supine with improvement in ROM and mobility of RUE. Continued with A/ROM in sitting, AA/ROM using 2# dowel rod for flexion. Of note-during abduction pt able to go through full ROM with one finger tap under arm at 50% ROM. Verbal cuing for form and technique.    Body Structure / Function / Physical Skills Endurance;ROM;Fascial restriction;Muscle spasms;Strength;Pain    Plan P: Try sidelying with a low weight. Continue to work on shoulder stability and strength. Update HEP    Consulted and Agree with Plan of Care Patient           Patient will benefit from skilled therapeutic intervention in order to improve the following deficits and impairments:   Body Structure / Function / Physical Skills: Endurance, ROM, Fascial restriction, Muscle spasms, Strength, Pain       Visit Diagnosis: Other symptoms and signs involving the musculoskeletal system  Stiffness of right shoulder, not elsewhere classified  Acute pain of right shoulder    Problem List Patient Active Problem List   Diagnosis Date Noted  . Atrial flutter ([place]) 02/19/2014  . Cardiac risk counseling 02/17/2014  . Hyperlipemia 02/16/2014  . Obesity 02/16/2014   Guadelupe Sabin, OTR/L  [phone] 01/06/2020, 9:44 AM  [facility] [address] [place], [place], [address] Phone: [phone]   Fax:  [phone]  Name: Arthur Le MRN: [id number] Date of Birth: 24-Aug-1950

## 2020-01-08 ENCOUNTER — Encounter (HOSPITAL_COMMUNITY): Payer: Self-pay

## 2020-01-08 ENCOUNTER — Other Ambulatory Visit: Payer: Self-pay

## 2020-01-08 ENCOUNTER — Ambulatory Visit (HOSPITAL_COMMUNITY): Payer: Medicare HMO

## 2020-01-08 DIAGNOSIS — M25611 Stiffness of right shoulder, not elsewhere classified: Secondary | ICD-10-CM | POA: Diagnosis not present

## 2020-01-08 DIAGNOSIS — R29898 Other symptoms and signs involving the musculoskeletal system: Secondary | ICD-10-CM | POA: Diagnosis not present

## 2020-01-08 DIAGNOSIS — M25511 Pain in right shoulder: Secondary | ICD-10-CM | POA: Diagnosis not present

## 2020-01-08 NOTE — Patient Instructions (Signed)
Complete 10-12 repetitions. 1-2x a day.    WAND PRESS  Start by lying on your back and holding a wand or cane so that your elbows are rested by your side.   Next, slowly push the wand upwards towards the ceiling so that your elbows become fully straightened.     Shoulder Flexion AAROM  Lie on your back with knees bent and hold a wand/dowel firmly with both hands. Use uninvolved arm to help raise involved arm over head as far as you're able. Maintain the position for 3 seconds and return to starting position.  Repeat 10 times, 3 times per day.  Perform this exercise within pain-free range, don't push into the pain.      AROM SHOULDER - INTERNAL EXTERNAL ROTATION   Lie on your back with a folder towel or pillow under your elbow with your elbow resting by your side.   Next, with a 90 degree bend in your elbow, rotate your shoulder so that your hand moves inward toward your stomach and then outward away from your stomach.

## 2020-01-08 NOTE — Therapy (Signed)
[place], [place], [address] Phone: [phone]   Fax:  [phone]  Occupational Therapy Treatment  Patient Details  Name: Arthur Le MRN: [id number] Date of Birth: 1950/07/26 Referring Provider (OT): Arther Abbott, MD   Encounter Date: 01/08/2020   OT End of Session - 01/08/20 1049    Visit Number 7    Number of Visits 9    Date for OT Re-Evaluation 01/15/20    Authorization Type [employer] HMO, no visit limit, $40 copay    Authorization Time Period [employer] approved 8 visits (12/18/19-01/15/20)    Authorization - Visit Number 6    Authorization - Number of Visits 8    Progress Note Due on Visit 10    OT Start Time 0950    OT Stop Time 1033    OT Time Calculation (min) 43 min    Activity Tolerance Patient tolerated treatment well;Patient limited by pain    Behavior During Therapy [facility] for tasks assessed/performed           Past Medical History:  Diagnosis Date  . Atrial flutter ([place])   . Hyperlipidemia     Past Surgical History:  Procedure Laterality Date  . TONSILLECTOMY AND ADENOIDECTOMY      There were no vitals filed for this visit.   Subjective Assessment - 01/08/20 0952    Subjective  S: Wednesday I played golf. But Saturday it was really bothering me and I couldn't move it as well.    Currently in Pain? Yes    Pain Score 3     Pain Location Shoulder    Pain Orientation Right    Pain Descriptors / Indicators Sore    Pain Type Acute pain    Pain Onset In the past 7 days    Pain Frequency Intermittent    Aggravating Factors  possibly increased movement and use. Did play golf (18 holes) on Wednesday    Pain Relieving Factors stretches    Effect of Pain on Daily Activities min effect    Multiple Pain Sites No              [facility] OT Assessment - 01/08/20 0954      Assessment   Medical Diagnosis Right shoulder adhesive capsulitis      Precautions   Precautions None                     OT Treatments/Exercises (OP) - 01/08/20 0954      Exercises   Exercises Shoulder      Shoulder Exercises: Supine   Protraction PROM;5 reps;AAROM;10 reps    Horizontal ABduction PROM;5 reps;AAROM;10 reps    External Rotation PROM;5 reps;AROM;10 reps    Internal Rotation PROM;5 reps;AROM;10 reps    Flexion PROM;5 reps;AAROM;10 reps    ABduction PROM;5 reps      Shoulder Exercises: ROM/Strengthening   UBE (Upper Arm Bike) Level 3 3' reverse only    Wall Wash 1'    Other ROM/Strengthening Exercises PVC pipe slide; 12X      Shoulder Exercises: Stretch   Other Shoulder Stretches Standing shoulder flexion stretch using countertop; 2" hold at end stretch; 12X      Manual Therapy   Manual Therapy Myofascial release    Manual therapy comments manual therapy completed prior to exercises.      Myofascial Release Myofascial release and manual stretching completed to right upper arm, trapezius region and scapularis region to decrease fascial  restrictions and increase ROM in a pain free zone.                   OT Education - 01/08/20 1047    Education Details AA/ROM supine: flexion, chest press. A/ROM supine: IR/er    Person(s) Educated Patient    Methods Explanation;Demonstration;Handout;Verbal cues    Comprehension Verbalized understanding;Returned demonstration            OT Short Term Goals - 12/21/19 1742      OT SHORT TERM GOAL #1   Title Patient will be educated and independent in a HEP in order to promote increased functional use of his RUE as dominant.    Time 4    Period Weeks    Status On-going    Target Date 01/15/20      OT SHORT TERM GOAL #2   Title Patient will decrease fascial restrictions of the RUE trapezius and scapularis regions to at least a min-mod level to improve mobility in a pain free zone.    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    Time 4    Period Weeks    Status On-going      OT SHORT TERM GOAL #4   Title Patient will improve RUE to WNL to improve the functional use of his dominant arm in overhead reaching and other daily tasks.    Time 4    Period Weeks    Status On-going      OT SHORT TERM GOAL #5   Title Patient will report a decrease in pain level of about 1/10 in the RUE while using his right hand to push himself up from a chair in order to promote ease with functional mobility and positional changes.    Time 4    Period Weeks    Status On-going                    Plan - 01/08/20 1100    Clinical Impression Statement A: Pt reports increased and limited mobility starting on Saturday which he believes could be due to his injection wearing off. Today focused increasing ROM within pain tolerance while modifying exercises to AA/ROM. VC for form and technique while remaining within pain tolerance level.    Body Structure / Function / Physical Skills Endurance;ROM;Fascial restriction;Muscle spasms;Strength;Pain    Plan P: Continue with AA/ROM if pain continues to be present otherwise progress to A/ROM. as able to tolerate.    OT Home Exercise Plan Eval: shoulder stretches 12/3: AA/ROM supine flexion, chest press A/ROM IR/er    Consulted and Agree with Plan of Care Patient           Patient will benefit from skilled therapeutic intervention in order to improve the following deficits and impairments:   Body Structure / Function / Physical Skills: Endurance, ROM, Fascial restriction, Muscle spasms, Strength, Pain       Visit Diagnosis: Other symptoms and signs involving the musculoskeletal system  Stiffness of right shoulder, not elsewhere classified  Acute pain of right shoulder    Problem List Patient Active Problem List   Diagnosis Date  Noted  . Atrial flutter ([place]) 02/19/2014  . Cardiac  risk counseling 02/17/2014  . Hyperlipemia 02/16/2014  . Obesity 02/16/2014   Ailene Ravel, OTR/L,CBIS  [phone]  01/08/2020, 11:07 AM  [place] [address] [place], [place], [address] Phone: [phone]   Fax:  [phone]  Name: Arthur Le MRN: [id number] Date of Birth: 03/28/1950

## 2020-01-13 ENCOUNTER — Other Ambulatory Visit: Payer: Self-pay

## 2020-01-13 ENCOUNTER — Ambulatory Visit (HOSPITAL_COMMUNITY): Payer: Medicare HMO

## 2020-01-13 ENCOUNTER — Encounter (HOSPITAL_COMMUNITY): Payer: Self-pay

## 2020-01-13 DIAGNOSIS — M25511 Pain in right shoulder: Secondary | ICD-10-CM

## 2020-01-13 DIAGNOSIS — R29898 Other symptoms and signs involving the musculoskeletal system: Secondary | ICD-10-CM | POA: Diagnosis not present

## 2020-01-13 DIAGNOSIS — M25611 Stiffness of right shoulder, not elsewhere classified: Secondary | ICD-10-CM

## 2020-01-13 NOTE — Therapy (Signed)
[place], [place], [address] Phone: [phone]   Fax:  [phone]  Occupational Therapy Treatment  Patient Details  Name: Arthur Le MRN: [id number] Date of Birth: Jan 05, 1951 Referring Provider (OT): Arther Abbott, MD   Encounter Date: 01/13/2020   OT End of Session - 01/13/20 0923    Visit Number 8    Number of Visits 9    Date for OT Re-Evaluation 01/15/20    Authorization Type [employer] HMO, no visit limit, $40 copay    Authorization Time Period [employer] approved 8 visits (12/18/19-01/15/20)    Authorization - Visit Number 7    Authorization - Number of Visits 8    Progress Note Due on Visit 10    OT Start Time 0900    OT Stop Time 0939    OT Time Calculation (min) 39 min    Activity Tolerance Patient tolerated treatment well;Patient limited by pain    Behavior During Therapy [facility] for tasks assessed/performed           Past Medical History:  Diagnosis Date  . Atrial flutter ([place])   . Hyperlipidemia     Past Surgical History:  Procedure Laterality Date  . TONSILLECTOMY AND ADENOIDECTOMY      There were no vitals filed for this visit.   Subjective Assessment - 01/13/20 0920    Subjective  S: It hurts when I try to move and use it but it doesn't bother me when it's at rest.    Currently in Pain? Yes   with movement/use   Pain Score 6     Pain Location Shoulder    Pain Orientation Right    Pain Descriptors / Indicators Sore    Pain Type Acute pain    Pain Radiating Towards N/A    Pain Onset In the past 7 days    Pain Frequency Intermittent    Aggravating Factors  with any movement and use    Pain Relieving Factors rest, heat    Effect of Pain on Daily Activities min effect    Multiple Pain Sites No              [facility] OT Assessment - 01/13/20 0920      Assessment   Medical Diagnosis Right shoulder adhesive capsulitis      Precautions   Precautions None                     OT Treatments/Exercises (OP) - 01/13/20 0920      Exercises   Exercises Shoulder      Shoulder Exercises: Supine   Protraction PROM;5 reps;AAROM;10 reps    Horizontal ABduction PROM;5 reps;AAROM;10 reps    External Rotation PROM;5 reps;AROM;10 reps    Internal Rotation PROM;5 reps;AROM;10 reps    Flexion PROM;5 reps;AAROM;10 reps    ABduction PROM;5 reps      Shoulder Exercises: ROM/Strengthening   Proximal Shoulder Strengthening, Supine 10X A/ROM no rest breaks    Proximal Shoulder Strengthening, Seated 10X A/ROM no rest breaks    Ball on Wall 1' flexion green ball      Functional Reaching Activities   High Level Completed functional high level reaching using Squigz while placing them on highest level of door. Barrington Ellison removed as well.       Manual Therapy   Manual Therapy Myofascial release    Manual therapy comments manual therapy completed prior to exercises.      Myofascial Release  Myofascial release and manual stretching completed to right upper arm, trapezius region and scapularis region to decrease fascial restrictions and increase ROM in a pain free zone.                     OT Short Term Goals - 12/21/19 1742      OT SHORT TERM GOAL #1   Title Patient will be educated and independent in a HEP in order to promote increased functional use of his RUE as dominant.    Time 4    Period Weeks    Status On-going    Target Date 01/15/20      OT SHORT TERM GOAL #2   Title Patient will decrease fascial restrictions of the RUE trapezius and scapularis regions to at least a min-mod level to improve mobility in a pain free zone.    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    Time 4    Period Weeks    Status On-going       OT SHORT TERM GOAL #4   Title Patient will improve RUE to WNL to improve the functional use of his dominant arm in overhead reaching and other daily tasks.    Time 4    Period Weeks    Status On-going      OT SHORT TERM GOAL #5   Title Patient will report a decrease in pain level of about 1/10 in the RUE while using his right hand to push himself up from a chair in order to promote ease with functional mobility and positional changes.    Time 4    Period Weeks    Status On-going                    Plan - 01/13/20 4540    Clinical Impression Statement A: Pt reports continued pain felt when completing any reaching task (flexion and abduction). Continues to experience pain between 90-120 degrees of movement. Focus on ROM and reaching tasks while monitoring pain level and tolerance. VC for form and technique were provided. Discussed plan for next session and moving forward.    Body Structure / Function / Physical Skills Endurance;ROM;Fascial restriction;Muscle spasms;Strength;Pain    Plan P: reassessment for MD appointment and insurance. Request more visits from insurance. Pt plans to wait to see what Dr. Aline Brochure recommends regarding continuing therapy versus working on it at home.    Consulted and Agree with Plan of Care Patient           Patient will benefit from skilled therapeutic intervention in order to improve the following deficits and impairments:   Body Structure / Function / Physical Skills: Endurance, ROM, Fascial restriction, Muscle spasms, Strength, Pain       Visit Diagnosis: Stiffness of right shoulder, not elsewhere classified  Other symptoms and signs involving the musculoskeletal system  Acute pain of right shoulder    Problem List Patient Active Problem List   Diagnosis Date Noted  . Atrial flutter ([place]) 02/19/2014  . Cardiac risk counseling 02/17/2014  . Hyperlipemia 02/16/2014  . Obesity 02/16/2014   Ailene Ravel, OTR/L,CBIS   [phone]  01/13/2020, 12:00 PM  [place]  [place] [address] [place], [place], [address] Phone: [phone]   Fax:  [phone]  Name: Arthur Le MRN: [id number] Date of Birth: 1950-12-08

## 2020-01-15 ENCOUNTER — Ambulatory Visit (HOSPITAL_COMMUNITY): Payer: Medicare HMO

## 2020-01-15 ENCOUNTER — Encounter (HOSPITAL_COMMUNITY): Payer: Self-pay

## 2020-01-15 ENCOUNTER — Other Ambulatory Visit: Payer: Self-pay

## 2020-01-15 DIAGNOSIS — R29898 Other symptoms and signs involving the musculoskeletal system: Secondary | ICD-10-CM | POA: Diagnosis not present

## 2020-01-15 DIAGNOSIS — M25511 Pain in right shoulder: Secondary | ICD-10-CM | POA: Diagnosis not present

## 2020-01-15 DIAGNOSIS — M25611 Stiffness of right shoulder, not elsewhere classified: Secondary | ICD-10-CM | POA: Diagnosis not present

## 2020-01-15 NOTE — Therapy (Signed)
[place] [address] [place], [place], [address] Phone: [phone]   Fax:  [phone]  Occupational Therapy Treatment Reassessment/re-cert Patient Details  Name: Arthur Le MRN: [id number] Date of Birth: 26-May-1950 Referring Provider (OT): Arther Abbott, MD  Progress Note Reporting Period 12/18/19 to 01/15/20  See note below for Objective Data and Assessment of Progress/Goals.      Encounter Date: 01/15/2020   OT End of Session - 01/15/20 1100    Visit Number 9    Number of Visits 17    Date for OT Re-Evaluation 92/01/00   (cert start on 08/16/17 for 4 weeks)   Authorization Type [employer] HMO, no visit limit, $40 copay    Authorization Time Period [employer] approved 8 visits (12/18/19-01/15/20) *Will request 8 more visits from [employer] to start on 02/15/20    Authorization - Visit Number 8    Authorization - Number of Visits 8    Progress Note Due on Visit 10    OT Start Time 0945   reassessment   OT Stop Time 1030    OT Time Calculation (min) 45 min    Activity Tolerance Patient tolerated treatment well;Patient limited by pain    Behavior During Therapy [facility] for tasks assessed/performed           Past Medical History:  Diagnosis Date  . Atrial flutter ([place])   . Hyperlipidemia     Past Surgical History:  Procedure Laterality Date  . TONSILLECTOMY AND ADENOIDECTOMY      There were no vitals filed for this visit.   Subjective Assessment - 01/15/20 1056    Subjective  S: My shoulder seemed to get worse as the shot wore off.    Currently in Pain? Yes   0/10 at rest. 6/10 with any use/movement   Pain Score 6     Pain Location Shoulder    Pain Orientation Right    Pain Descriptors / Indicators Sore;Sharp    Pain Type Acute pain    Pain Onset 1 to 4 weeks ago    Pain Frequency Intermittent    Aggravating Factors  with any movement and use    Pain Relieving Factors rest, heat    Effect of Pain on Daily  Activities when pain is a 6/10 it's a severe effect    Multiple Pain Sites No              [facility] OT Assessment - 01/15/20 0949      Assessment   Medical Diagnosis Right shoulder adhesive capsulitis    Referring Provider (OT) Arther Abbott, MD    Onset Date/Surgical Date --   4 months ago     Precautions   Precautions None      Prior Function   Level of Independence Independent      Observation/Other Assessments   Focus on Therapeutic Outcomes (FOTO)  57/100   previous: 83/100     ROM / Strength   AROM / PROM / Strength AROM;PROM;Strength      Palpation   Palpation comment Min fascial restrictions in the upper arm/anterior deltoid region only.      AROM   Overall AROM Comments Assessed seated, IR/er adducted    AROM Assessment Site Shoulder    Right/Left Shoulder Right    Right Shoulder Flexion 85 Degrees   previous: 150   Right Shoulder ABduction 60 Degrees   previous: 125   Right Shoulder Internal Rotation 90 Degrees   previous: same  Right Shoulder External Rotation 70 Degrees   previous: 53     PROM   Overall PROM Comments Assessed supine, IR/er adducted    PROM Assessment Site Shoulder    Right/Left Shoulder Right    Right Shoulder Flexion 165 Degrees   previous: 160   Right Shoulder ABduction 180 Degrees   previous: 165   Right Shoulder Internal Rotation 90 Degrees   previous: same   Right Shoulder External Rotation 50 Degrees   previous: 65     Strength   Overall Strength Comments Assessed seated, IR/er adducted. middle trapezius: 3/5(eval: 4-/5), lower trapezius: 5/5 (eval: 4/5)    Strength Assessment Site Shoulder    Right/Left Shoulder Right    Right Shoulder Flexion 3-/5   previous: 5/5   Right Shoulder ABduction 3-/5   previous: 5/5   Right Shoulder Internal Rotation 5/5   previous: same   Right Shoulder External Rotation 5/5   previous: 4+/5                   OT Treatments/Exercises (OP) - 01/15/20 1058      Exercises   Exercises  Shoulder      Shoulder Exercises: Supine   Protraction PROM;5 reps    Horizontal ABduction PROM;5 reps    External Rotation PROM;5 reps    Internal Rotation PROM;5 reps    Flexion PROM;5 reps    ABduction PROM;5 reps      Manual Therapy   Manual Therapy Myofascial release    Manual therapy comments manual therapy completed prior to exercises.      Myofascial Release Myofascial release and manual stretching completed to right upper arm, trapezius region and scapularis region to decrease fascial restrictions and increase ROM in a pain free zone.                   OT Education - 01/15/20 1058    Education Details Discussed progress in therapy and current deficits as well as any regressions. Reviewed goals and FOTO score today versus initial visit. Reviewed HEP and recommended continuing established program while modifiying as needed based on pain level. Encouraged hold with therapy until follow up visit with MD to discuss recommendations and next step of treatment. Pt verbalized understanding.    Person(s) Educated Patient    Methods Explanation;Handout    Comprehension Verbalized understanding            OT Short Term Goals - 01/15/20 1012      OT SHORT TERM GOAL #1   Title Patient will be educated and independent in a HEP in order to promote increased functional use of his RUE as dominant.    Time 4    Period Weeks    Status Achieved    Target Date 01/15/20      OT SHORT TERM GOAL #2   Title Patient will decrease fascial restrictions of the RUE trapezius and scapularis regions to at least a min-mod level to improve mobility in a pain free zone.    Time 4    Period Weeks    Status Achieved      OT SHORT TERM GOAL #3   Title Patient will improve RUE strength with er and middle/lower trapezius movements to at least a 5/5 in order to promote increased stability of the shoulder for instrument playing at church.    Baseline 12/10: Achieved goal for external rotation and  lower trapezius strength. Unmet for middle trapezius strength due to pain and inability  to maintain position without pain.    Time 4    Period Weeks    Status Partially Met      OT SHORT TERM GOAL #4   Title Patient will improve RUE to WNL to improve the functional use of his dominant arm in overhead reaching and other daily tasks.    Time 4    Period Weeks    Status Not Met      OT SHORT TERM GOAL #5   Title Patient will report a decrease in pain level of about 1/10 in the RUE while using his right hand to push himself up from a chair in order to promote ease with functional mobility and positional changes.    Baseline 12/10: Patient achieved goal when related to pushing himself up from a chair in which he has no pain. Patient reports a high level of pain with any reaching to shoulder level or increased use in which it's a 6/10. Overall, pain is still present.    Time 4    Period Weeks    Status Achieved   See comment under goal.                   Plan - 01/15/20 1102    Clinical Impression Statement A: When patient initially started therapy he progressed very well increasing his ROM and strength. Pt received injection from the MD prior to starting therapy and was feeling very little pain. As injection wore off, patient began to experience increased pain (approximately 90-120 degrees of shoulder flexion and abduction/adduction) and gradual decrease in ROM. He began to experience less and less ROM. Today, we completed a reassessment. P/ROM is close to full or functional. Patient has decreased A/ROM shoulder flexion and abduction since evaluation as well as strength during shoulder flexion, abduction, and middle trapezius region. At rest, there is no report of pain. With any movement or use of his RUE, he experiences a high level of pain. Discussed plan moving forward and patient wishes to see what Dr. Aline Brochure says at the follow up visit. Unsure if a MRI will be recommended or for  patient to continue with therapy. I will request additional visits from insurance to start on 02/15/20 if needed. Patient has met 3/5 therapy goals although one goal met for pain only focused on the pain felt when pushing up from both hands to stand up from a chair; which he experiences no pain. A high level of pain is felt with all other motions and increased use. Pt continues to present with deficits in RUE pain, strength and A/ROM.    Body Structure / Function / Physical Skills Endurance;ROM;Fascial restriction;Muscle spasms;Strength;Pain    OT Frequency 2x / week   to start on 02/15/20.   OT Duration 4 weeks    Plan P: Hold therapy until follow up visit with MD. Pt will contact clinic after appointment to let us know the plan moving forward. If it's recommended that patinet return to therapy he will benefit from skilled OT services to focus on mentioned deficits above.    Consulted and Agree with Plan of Care Patient           Patient will benefit from skilled therapeutic intervention in order to improve the following deficits and impairments:   Body Structure / Function / Physical Skills: Endurance,ROM,Fascial restriction,Muscle spasms,Strength,Pain       Visit Diagnosis: Other symptoms and signs involving the musculoskeletal system - Plan: Ot plan of care cert/re-cert  Acute pain of right shoulder - Plan: Ot plan of care cert/re-cert  Stiffness of right shoulder, not elsewhere classified - Plan: Ot plan of care cert/re-cert    Problem List Patient Active Problem List   Diagnosis Date Noted  . Atrial flutter ([place]) 02/19/2014  . Cardiac risk counseling 02/17/2014  . Hyperlipemia 02/16/2014  . Obesity 02/16/2014    Ailene Ravel, OTR/L,CBIS  [phone]  01/15/2020, 11:12 AM  [place] [address] [place], [place], [address] Phone: [phone]   Fax:  [phone]  Name: Arthur Le MRN: [id number] Date of Birth:  [birth year]

## 2020-01-27 ENCOUNTER — Other Ambulatory Visit: Payer: Self-pay

## 2020-01-27 MED ORDER — DILTIAZEM HCL ER COATED BEADS 120 MG PO CP24: 120.0000 mg | ORAL_CAPSULE | Freq: Every day | ORAL | 3 refills | Status: DC

## 2020-01-27 NOTE — Telephone Encounter (Signed)
Refilled diltiazem 120 mg to [facility]

## 2020-02-01 ENCOUNTER — Other Ambulatory Visit: Payer: Self-pay

## 2020-02-01 MED ORDER — APIXABAN 5 MG PO TABS: 5.0000 mg | ORAL_TABLET | Freq: Two times a day (BID) | ORAL | 3 refills | Status: DC

## 2020-02-01 NOTE — Telephone Encounter (Signed)
Refilled eliquis 5 mg bid #180 to [facility]

## 2020-02-03 ENCOUNTER — Encounter: Payer: Self-pay | Admitting: Orthopedic Surgery

## 2020-02-03 ENCOUNTER — Ambulatory Visit (INDEPENDENT_AMBULATORY_CARE_PROVIDER_SITE_OTHER): Payer: Medicare HMO | Admitting: Orthopedic Surgery

## 2020-02-03 ENCOUNTER — Other Ambulatory Visit: Payer: Self-pay

## 2020-02-03 VITALS — BP 145/103 | HR 77 | Ht 68.0 in | Wt 250.0 lb

## 2020-02-03 DIAGNOSIS — M7542 Impingement syndrome of left shoulder: Secondary | ICD-10-CM

## 2020-02-03 DIAGNOSIS — G8929 Other chronic pain: Secondary | ICD-10-CM | POA: Diagnosis not present

## 2020-02-03 DIAGNOSIS — M75121 Complete rotator cuff tear or rupture of right shoulder, not specified as traumatic: Secondary | ICD-10-CM

## 2020-02-03 NOTE — Progress Notes (Signed)
FOLLOW-UP OFFICE VISIT   Encounter Diagnoses  Name Primary?  . Adhesive capsulitis of right shoulder Yes  . Chronic right shoulder pain   . Chronic left shoulder pain    Chief Complaint  Patient presents with  . Shoulder Pain    Left / still painful but not as severe as in past    Right shoulder:   69 year old male with pain in both shoulders left and right sent for physical therapy after injection  He complains more of soreness now in both shoulders he has weakness in the right shoulder he is regained full range of motion on the left some of his hip adhesive capsulitis has resolved on the right as well     + EXAM FINDINGS:   Right shoulder passive range of motion is normal active range of motion is less than 90 degrees of flexion and abduction he has a drop arm sign on the right  Passive range of motion with his arm at his side is normal on the right abduction passively normal as well  Left shoulder full range of motion   ASSESSMENT AND PLAN  Encounter Diagnoses  Name Primary?  . Chronic right shoulder pain Yes  . Nontraumatic complete tear of right rotator cuff   . Impingement syndrome of left shoulder     MRI right shoulder  Rotator cuff tear right shoulder  Left shoulder continue exercises impingement syndrome left shoulder

## 2020-02-04 ENCOUNTER — Other Ambulatory Visit: Payer: Self-pay

## 2020-02-04 MED ORDER — DILTIAZEM HCL ER COATED BEADS 120 MG PO CP24: 120.0000 mg | ORAL_CAPSULE | Freq: Every day | ORAL | 3 refills | Status: DC

## 2020-02-04 NOTE — Telephone Encounter (Signed)
Incoming fax for medication refill  Diltiazem HCL  Refill sent to [facility] (Mail Order)

## 2020-02-05 DIAGNOSIS — I10 Essential (primary) hypertension: Secondary | ICD-10-CM | POA: Diagnosis not present

## 2020-02-05 DIAGNOSIS — E782 Mixed hyperlipidemia: Secondary | ICD-10-CM | POA: Diagnosis not present

## 2020-02-05 DIAGNOSIS — E1169 Type 2 diabetes mellitus with other specified complication: Secondary | ICD-10-CM | POA: Diagnosis not present

## 2020-02-05 DIAGNOSIS — C4362 Malignant melanoma of left upper limb, including shoulder: Secondary | ICD-10-CM | POA: Diagnosis not present

## 2020-02-05 DIAGNOSIS — I4892 Unspecified atrial flutter: Secondary | ICD-10-CM | POA: Diagnosis not present

## 2020-02-05 DIAGNOSIS — E041 Nontoxic single thyroid nodule: Secondary | ICD-10-CM | POA: Diagnosis not present

## 2020-02-05 DIAGNOSIS — I4891 Unspecified atrial fibrillation: Secondary | ICD-10-CM | POA: Diagnosis not present

## 2020-02-05 DIAGNOSIS — K219 Gastro-esophageal reflux disease without esophagitis: Secondary | ICD-10-CM | POA: Diagnosis not present

## 2020-02-12 ENCOUNTER — Ambulatory Visit: Payer: Medicare HMO | Admitting: Cardiology

## 2020-02-16 ENCOUNTER — Ambulatory Visit (HOSPITAL_COMMUNITY)
Admission: RE | Admit: 2020-02-16 | Discharge: 2020-02-16 | Disposition: A | Discharge status: Home / Self Care | Payer: Medicare HMO | Source: Ambulatory Visit | Attending: Orthopedic Surgery | Admitting: Orthopedic Surgery

## 2020-02-16 ENCOUNTER — Other Ambulatory Visit: Payer: Self-pay

## 2020-02-16 DIAGNOSIS — M19011 Primary osteoarthritis, right shoulder: Secondary | ICD-10-CM | POA: Diagnosis not present

## 2020-02-16 DIAGNOSIS — S46011A Strain of muscle(s) and tendon(s) of the rotator cuff of right shoulder, initial encounter: Secondary | ICD-10-CM | POA: Diagnosis not present

## 2020-02-16 DIAGNOSIS — M25511 Pain in right shoulder: Secondary | ICD-10-CM | POA: Diagnosis not present

## 2020-02-16 DIAGNOSIS — G8929 Other chronic pain: Secondary | ICD-10-CM | POA: Diagnosis not present

## 2020-02-16 DIAGNOSIS — M7551 Bursitis of right shoulder: Secondary | ICD-10-CM | POA: Diagnosis not present

## 2020-02-17 DIAGNOSIS — M9903 Segmental and somatic dysfunction of lumbar region: Secondary | ICD-10-CM | POA: Diagnosis not present

## 2020-02-17 DIAGNOSIS — M9902 Segmental and somatic dysfunction of thoracic region: Secondary | ICD-10-CM | POA: Diagnosis not present

## 2020-02-17 DIAGNOSIS — M542 Cervicalgia: Secondary | ICD-10-CM | POA: Diagnosis not present

## 2020-02-17 DIAGNOSIS — M546 Pain in thoracic spine: Secondary | ICD-10-CM | POA: Diagnosis not present

## 2020-02-17 DIAGNOSIS — M9901 Segmental and somatic dysfunction of cervical region: Secondary | ICD-10-CM | POA: Diagnosis not present

## 2020-02-29 ENCOUNTER — Ambulatory Visit (INDEPENDENT_AMBULATORY_CARE_PROVIDER_SITE_OTHER): Payer: Medicare HMO | Admitting: Orthopedic Surgery

## 2020-02-29 ENCOUNTER — Encounter: Payer: Self-pay | Admitting: Orthopedic Surgery

## 2020-02-29 ENCOUNTER — Other Ambulatory Visit: Payer: Self-pay

## 2020-02-29 DIAGNOSIS — M7501 Adhesive capsulitis of right shoulder: Secondary | ICD-10-CM | POA: Diagnosis not present

## 2020-02-29 DIAGNOSIS — M25511 Pain in right shoulder: Secondary | ICD-10-CM

## 2020-02-29 DIAGNOSIS — G8929 Other chronic pain: Secondary | ICD-10-CM

## 2020-02-29 DIAGNOSIS — M25512 Pain in left shoulder: Secondary | ICD-10-CM

## 2020-02-29 NOTE — Patient Instructions (Signed)
Continue home exercises  Call if pain worsens

## 2020-02-29 NOTE — Progress Notes (Signed)
MRI RESULTS FOLLOW UP   Encounter Diagnoses  Name Primary?  . Chronic right shoulder pain Yes  . Chronic left shoulder pain   . Adhesive capsulitis of right shoulder     Chief Complaint  Patient presents with  . Shoulder Pain    MRI right shoulder,     70 year old bilateral shoulder pain treated with injections and therapy.  Had persistent pain right shoulder sent for MRI  He says his right and left shoulder are both better including improved range of motion right shoulder   + EXAM FINDINGS: Right shoulder 120 degrees of flexion.  Left shoulder 150 degrees of flexion  MY READING OF THE MRI  AC joint arthritis moderate Glenohumeral joint arthritis mild Synovitis Rotator cuff tendinosis  MRI REPORT:  Rotator cuff: Significant supraspinatus tendinopathy/tendinosis with interstitial tears and bursal surface fraying, fibrillation and possible shallow surface tears. No full-thickness retracted tear is identified. Mild to moderate tendinopathy involving the infraspinatus and subscapularis tendons. Mild degenerative changes with degenerative chondrosis and small joint effusion. There is thickening of the capsular structures in the axillary recess which can be seen with adhesive capsulitis or synovitis.  IMPRESSION: 1. Significant supraspinatus tendinopathy/tendinosis with interstitial tears and bursal surface fraying, fibrillation and possible shallow surface tears. No full-thickness retracted tear. 2. Mild to moderate infraspinatus and subscapularis tendinopathy. 3. Intact long head biceps tendon and grossly intact glenoid labrum. 4. Moderate to advanced AC joint degenerative changes and mild subacromial spurring. 5. There is thickening of the capsular structures in the axillary recess which can be seen with adhesive capsulitis or synovitis. 6. Moderate subacromial/subdeltoid bursitis.     Electronically Signed   By: Marijo Sanes M.D.   On: 02/17/2020  08:32    ASSESSMENT AND PLAN :   Rotator cuff is intact.  He has age expected arthritic changes.  Recommend continued home therapy and exercises call if pain worsens or if motion decreases

## 2020-03-03 ENCOUNTER — Encounter (HOSPITAL_COMMUNITY): Payer: Self-pay

## 2020-03-03 NOTE — Therapy (Signed)
[facility] [place], [place], [address] Phone: [phone]   Fax:  [phone]  Patient Details  Name: Arthur Le MRN: [id number] Date of Birth: 1951-01-10 Referring Provider:  No ref. provider found  Encounter Date: 03/03/2020  OCCUPATIONAL THERAPY DISCHARGE SUMMARY  Visits from Start of Care: 9  Current functional level related to goals / functional outcomes: When patient initially started therapy he progressed very well increasing his ROM and strength. Pt received injection from the MD prior to starting therapy and was feeling very little pain. As injection wore off, patient began to experience increased pain (approximately 90-120 degrees of shoulder flexion and abduction/adduction) and gradual decrease in ROM. He began to experience less and less ROM. Today, we completed a reassessment. P/ROM is close to full or functional. Patient has decreased A/ROM shoulder flexion and abduction since evaluation as well as strength during shoulder flexion, abduction, and middle trapezius region. At rest, there is no report of pain. With any movement or use of his RUE, he experiences a high level of pain. Discussed plan moving forward and patient wishes to see what Dr. Aline Brochure says at the follow up visit. Unsure if a MRI will be recommended or for patient to continue with therapy. I will request additional visits from insurance to start on 02/15/20 if needed. Patient has met 3/5 therapy goals although one goal met for pain only focused on the pain felt when pushing up from both hands to stand up from a chair; which he experiences no pain. A high level of pain is felt with all other motions and increased use. Pt continues to present with deficits in RUE pain, strength and A/ROM.    Remaining deficits: Pain, fascial restriction, decrease strength and ROM.   Education / Equipment: Shoulder HEP Plan: Patient agrees to discharge.  Patient goals  were partially met. Patient is being discharged due to not returning since the last visit.  ?????           Ailene Ravel, OTR/L,CBIS  [phone]  03/03/2020, 4:33 PM  [facility] [address] [place], [place], [address] Phone: [phone]   Fax:  [phone]

## 2020-03-04 DIAGNOSIS — M9903 Segmental and somatic dysfunction of lumbar region: Secondary | ICD-10-CM | POA: Diagnosis not present

## 2020-03-04 DIAGNOSIS — M546 Pain in thoracic spine: Secondary | ICD-10-CM | POA: Diagnosis not present

## 2020-03-04 DIAGNOSIS — M9901 Segmental and somatic dysfunction of cervical region: Secondary | ICD-10-CM | POA: Diagnosis not present

## 2020-03-04 DIAGNOSIS — M9902 Segmental and somatic dysfunction of thoracic region: Secondary | ICD-10-CM | POA: Diagnosis not present

## 2020-03-04 DIAGNOSIS — M542 Cervicalgia: Secondary | ICD-10-CM | POA: Diagnosis not present

## 2020-03-07 DIAGNOSIS — D225 Melanocytic nevi of trunk: Secondary | ICD-10-CM | POA: Diagnosis not present

## 2020-03-07 DIAGNOSIS — X32XXXD Exposure to sunlight, subsequent encounter: Secondary | ICD-10-CM | POA: Diagnosis not present

## 2020-03-07 DIAGNOSIS — L570 Actinic keratosis: Secondary | ICD-10-CM | POA: Diagnosis not present

## 2020-03-07 DIAGNOSIS — Z08 Encounter for follow-up examination after completed treatment for malignant neoplasm: Secondary | ICD-10-CM | POA: Diagnosis not present

## 2020-03-07 DIAGNOSIS — Z85820 Personal history of malignant melanoma of skin: Secondary | ICD-10-CM | POA: Diagnosis not present

## 2020-03-07 DIAGNOSIS — Z1283 Encounter for screening for malignant neoplasm of skin: Secondary | ICD-10-CM | POA: Diagnosis not present

## 2020-03-30 DIAGNOSIS — I4891 Unspecified atrial fibrillation: Secondary | ICD-10-CM | POA: Diagnosis not present

## 2020-03-30 DIAGNOSIS — K219 Gastro-esophageal reflux disease without esophagitis: Secondary | ICD-10-CM | POA: Diagnosis not present

## 2020-03-30 DIAGNOSIS — I10 Essential (primary) hypertension: Secondary | ICD-10-CM | POA: Diagnosis not present

## 2020-03-30 DIAGNOSIS — I4892 Unspecified atrial flutter: Secondary | ICD-10-CM | POA: Diagnosis not present

## 2020-03-30 DIAGNOSIS — Z0001 Encounter for general adult medical examination with abnormal findings: Secondary | ICD-10-CM | POA: Diagnosis not present

## 2020-03-30 DIAGNOSIS — Z712 Person consulting for explanation of examination or test findings: Secondary | ICD-10-CM | POA: Diagnosis not present

## 2020-03-30 DIAGNOSIS — E1169 Type 2 diabetes mellitus with other specified complication: Secondary | ICD-10-CM | POA: Diagnosis not present

## 2020-03-30 DIAGNOSIS — E782 Mixed hyperlipidemia: Secondary | ICD-10-CM | POA: Diagnosis not present

## 2020-03-30 DIAGNOSIS — C4362 Malignant melanoma of left upper limb, including shoulder: Secondary | ICD-10-CM | POA: Diagnosis not present

## 2020-04-06 DIAGNOSIS — E041 Nontoxic single thyroid nodule: Secondary | ICD-10-CM | POA: Diagnosis not present

## 2020-04-06 DIAGNOSIS — I4892 Unspecified atrial flutter: Secondary | ICD-10-CM | POA: Diagnosis not present

## 2020-04-06 DIAGNOSIS — C4362 Malignant melanoma of left upper limb, including shoulder: Secondary | ICD-10-CM | POA: Diagnosis not present

## 2020-04-06 DIAGNOSIS — E782 Mixed hyperlipidemia: Secondary | ICD-10-CM | POA: Diagnosis not present

## 2020-04-06 DIAGNOSIS — K219 Gastro-esophageal reflux disease without esophagitis: Secondary | ICD-10-CM | POA: Diagnosis not present

## 2020-04-06 DIAGNOSIS — I4891 Unspecified atrial fibrillation: Secondary | ICD-10-CM | POA: Diagnosis not present

## 2020-04-06 DIAGNOSIS — E1169 Type 2 diabetes mellitus with other specified complication: Secondary | ICD-10-CM | POA: Diagnosis not present

## 2020-04-06 DIAGNOSIS — I10 Essential (primary) hypertension: Secondary | ICD-10-CM | POA: Diagnosis not present

## 2020-04-18 ENCOUNTER — Telehealth: Payer: Self-pay | Admitting: *Deleted

## 2020-04-18 DIAGNOSIS — M9903 Segmental and somatic dysfunction of lumbar region: Secondary | ICD-10-CM | POA: Diagnosis not present

## 2020-04-18 DIAGNOSIS — M546 Pain in thoracic spine: Secondary | ICD-10-CM | POA: Diagnosis not present

## 2020-04-18 DIAGNOSIS — M9901 Segmental and somatic dysfunction of cervical region: Secondary | ICD-10-CM | POA: Diagnosis not present

## 2020-04-18 DIAGNOSIS — M542 Cervicalgia: Secondary | ICD-10-CM | POA: Diagnosis not present

## 2020-04-18 DIAGNOSIS — M62830 Muscle spasm of back: Secondary | ICD-10-CM | POA: Diagnosis not present

## 2020-04-18 DIAGNOSIS — M9902 Segmental and somatic dysfunction of thoracic region: Secondary | ICD-10-CM | POA: Diagnosis not present

## 2020-04-18 NOTE — Telephone Encounter (Signed)
Called to notify pt that he has not been approved for [employer] patient assistance foundation (Eliquis) until he has meet his out of pocket expenses of $39.22. Pt voiced understanding.

## 2020-04-22 MED ORDER — APIXABAN 5 MG PO TABS: 5.0000 mg | ORAL_TABLET | Freq: Two times a day (BID) | ORAL | 3 refills | Status: DC

## 2020-04-22 NOTE — Addendum Note (Signed)
Addended by: Christella Scheuermann C on: 04/22/2020 12:41 PM   Modules accepted: Orders

## 2020-04-22 NOTE — Telephone Encounter (Signed)
Medication approved and sent to [facility]

## 2020-04-22 NOTE — Telephone Encounter (Signed)
Pt called requesting a Rx for Eliquis to be sent to [facility] in [place] so he can reach his out of pocket for his assistance

## 2020-05-02 DIAGNOSIS — M7502 Adhesive capsulitis of left shoulder: Secondary | ICD-10-CM | POA: Diagnosis not present

## 2020-05-02 DIAGNOSIS — M7501 Adhesive capsulitis of right shoulder: Secondary | ICD-10-CM | POA: Diagnosis not present

## 2020-05-04 DIAGNOSIS — C4362 Malignant melanoma of left upper limb, including shoulder: Secondary | ICD-10-CM | POA: Diagnosis not present

## 2020-05-04 DIAGNOSIS — I10 Essential (primary) hypertension: Secondary | ICD-10-CM | POA: Diagnosis not present

## 2020-05-04 DIAGNOSIS — I4892 Unspecified atrial flutter: Secondary | ICD-10-CM | POA: Diagnosis not present

## 2020-05-04 DIAGNOSIS — K219 Gastro-esophageal reflux disease without esophagitis: Secondary | ICD-10-CM | POA: Diagnosis not present

## 2020-05-04 DIAGNOSIS — E041 Nontoxic single thyroid nodule: Secondary | ICD-10-CM | POA: Diagnosis not present

## 2020-05-04 DIAGNOSIS — E782 Mixed hyperlipidemia: Secondary | ICD-10-CM | POA: Diagnosis not present

## 2020-05-04 DIAGNOSIS — E1169 Type 2 diabetes mellitus with other specified complication: Secondary | ICD-10-CM | POA: Diagnosis not present

## 2020-05-04 DIAGNOSIS — I4891 Unspecified atrial fibrillation: Secondary | ICD-10-CM | POA: Diagnosis not present

## 2020-05-18 ENCOUNTER — Telehealth: Payer: Self-pay

## 2020-05-18 MED ORDER — DILTIAZEM HCL ER COATED BEADS 120 MG PO CP24: 120.0000 mg | ORAL_CAPSULE | Freq: Every day | ORAL | 3 refills | Status: DC

## 2020-05-18 NOTE — Telephone Encounter (Signed)
Medication refill request for diltiazem 120 mg capsules approved and sent to [facility].

## 2020-05-30 DIAGNOSIS — C4362 Malignant melanoma of left upper limb, including shoulder: Secondary | ICD-10-CM | POA: Diagnosis not present

## 2020-06-01 DIAGNOSIS — M9901 Segmental and somatic dysfunction of cervical region: Secondary | ICD-10-CM | POA: Diagnosis not present

## 2020-06-01 DIAGNOSIS — M546 Pain in thoracic spine: Secondary | ICD-10-CM | POA: Diagnosis not present

## 2020-06-01 DIAGNOSIS — M9902 Segmental and somatic dysfunction of thoracic region: Secondary | ICD-10-CM | POA: Diagnosis not present

## 2020-06-01 DIAGNOSIS — M9903 Segmental and somatic dysfunction of lumbar region: Secondary | ICD-10-CM | POA: Diagnosis not present

## 2020-06-01 DIAGNOSIS — M62830 Muscle spasm of back: Secondary | ICD-10-CM | POA: Diagnosis not present

## 2020-06-01 DIAGNOSIS — M542 Cervicalgia: Secondary | ICD-10-CM | POA: Diagnosis not present

## 2020-06-28 DIAGNOSIS — M62830 Muscle spasm of back: Secondary | ICD-10-CM | POA: Diagnosis not present

## 2020-06-28 DIAGNOSIS — M9902 Segmental and somatic dysfunction of thoracic region: Secondary | ICD-10-CM | POA: Diagnosis not present

## 2020-06-28 DIAGNOSIS — M542 Cervicalgia: Secondary | ICD-10-CM | POA: Diagnosis not present

## 2020-06-28 DIAGNOSIS — M9901 Segmental and somatic dysfunction of cervical region: Secondary | ICD-10-CM | POA: Diagnosis not present

## 2020-06-28 DIAGNOSIS — M9903 Segmental and somatic dysfunction of lumbar region: Secondary | ICD-10-CM | POA: Diagnosis not present

## 2020-06-28 DIAGNOSIS — M546 Pain in thoracic spine: Secondary | ICD-10-CM | POA: Diagnosis not present

## 2020-07-01 DIAGNOSIS — K219 Gastro-esophageal reflux disease without esophagitis: Secondary | ICD-10-CM | POA: Diagnosis not present

## 2020-07-01 DIAGNOSIS — I10 Essential (primary) hypertension: Secondary | ICD-10-CM | POA: Diagnosis not present

## 2020-07-05 ENCOUNTER — Other Ambulatory Visit: Payer: Self-pay | Admitting: Cardiology

## 2020-07-18 ENCOUNTER — Ambulatory Visit: Payer: Medicare HMO | Admitting: Cardiology

## 2020-07-25 DIAGNOSIS — M9901 Segmental and somatic dysfunction of cervical region: Secondary | ICD-10-CM | POA: Diagnosis not present

## 2020-07-25 DIAGNOSIS — M9902 Segmental and somatic dysfunction of thoracic region: Secondary | ICD-10-CM | POA: Diagnosis not present

## 2020-07-25 DIAGNOSIS — M546 Pain in thoracic spine: Secondary | ICD-10-CM | POA: Diagnosis not present

## 2020-07-25 DIAGNOSIS — M62830 Muscle spasm of back: Secondary | ICD-10-CM | POA: Diagnosis not present

## 2020-07-25 DIAGNOSIS — M542 Cervicalgia: Secondary | ICD-10-CM | POA: Diagnosis not present

## 2020-07-25 DIAGNOSIS — M9903 Segmental and somatic dysfunction of lumbar region: Secondary | ICD-10-CM | POA: Diagnosis not present

## 2020-07-29 DIAGNOSIS — M25512 Pain in left shoulder: Secondary | ICD-10-CM | POA: Diagnosis not present

## 2020-07-29 DIAGNOSIS — M25511 Pain in right shoulder: Secondary | ICD-10-CM | POA: Diagnosis not present

## 2020-07-29 DIAGNOSIS — M79642 Pain in left hand: Secondary | ICD-10-CM | POA: Diagnosis not present

## 2020-07-29 DIAGNOSIS — M79641 Pain in right hand: Secondary | ICD-10-CM | POA: Diagnosis not present

## 2020-08-01 ENCOUNTER — Encounter: Payer: Self-pay | Admitting: *Deleted

## 2020-08-01 ENCOUNTER — Encounter: Payer: Self-pay | Admitting: Cardiology

## 2020-08-01 ENCOUNTER — Ambulatory Visit: Payer: Medicare HMO | Admitting: Cardiology

## 2020-08-01 ENCOUNTER — Other Ambulatory Visit: Payer: Self-pay

## 2020-08-01 VITALS — BP 112/76 | HR 100 | Ht 68.0 in | Wt 249.0 lb

## 2020-08-01 DIAGNOSIS — I4891 Unspecified atrial fibrillation: Secondary | ICD-10-CM

## 2020-08-01 DIAGNOSIS — I10 Essential (primary) hypertension: Secondary | ICD-10-CM | POA: Diagnosis not present

## 2020-08-01 DIAGNOSIS — E782 Mixed hyperlipidemia: Secondary | ICD-10-CM | POA: Diagnosis not present

## 2020-08-01 NOTE — Patient Instructions (Signed)

## 2020-08-01 NOTE — Progress Notes (Signed)
Clinical Summary Arthur Le is a 70 y.o.male seen today for follow up of the following medical problems.      1. Aflutter/Afib - CHADS2Vasc score is 3, he is on eliquis.       - no recent palpitaitons - no bleeding on eliquis     2. Hyperlipidemia   - muscle aches on zetia, he has stopped taking - taking atorvastatin      3. HTN - compliant with meds    4. Le edema - rarely needs prn lasix.      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 They have stayed open during the pandemic, using several different protocols to keep everyone safe he helps organize   Past Medical History:  Diagnosis Date   Atrial flutter (HCC)    Hyperlipidemia      Allergies  Allergen Reactions   Lisinopril Cough   Cefuroxime Axetil Nausea And Vomiting    Upset stomach   Doxycycline Rash     Current Outpatient Medications  Medication Sig Dispense Refill   apixaban (ELIQUIS) 5 MG TABS tablet Take 1 tablet (5 mg total) by mouth 2 (two) times daily. 180 tablet 3   atorvastatin (LIPITOR) 40 MG tablet Take 40 mg by mouth daily.     Cholecalciferol (VITAMIN D-3 PO) Take 5,000 Int'l Units by mouth.     Coenzyme Q10 (COQ-10) 400 MG CAPS Take 1 capsule by mouth daily.     diltiazem (CARDIZEM CD) 120 MG 24 hr capsule Take 1 capsule (120 mg total) by mouth daily. 90 capsule 3   ezetimibe (ZETIA) 10 MG tablet Take by mouth.     furosemide (LASIX) 20 MG tablet Take 20 mg by mouth as needed.     Lecithin 1200 MG CAPS Take 1,200 mg by mouth daily.     loratadine (CLARITIN) 10 MG tablet Take 10 mg by mouth daily.     losartan (COZAAR) 25 MG tablet TAKE 1/2 TABLET EVERY DAY 45 tablet 3   metFORMIN (GLUCOPHAGE) 500 MG tablet Take 500 mg by mouth 2 (two) times daily with a meal.     metoprolol tartrate (LOPRESSOR) 100 MG tablet TAKE 1 TABLET TWICE DAILY 180 tablet 3   Misc Natural Products (GINKOGIN PO) Take 240 mg by mouth daily.     Omega-3 Fatty Acids (FISH OIL) 1000  MG CAPS Take 1,000 mg by mouth daily.     omeprazole (PRILOSEC) 20 MG capsule Take 20 mg by mouth daily.     vitamin C (ASCORBIC ACID) 500 MG tablet Take 500 mg by mouth daily.     Zinc 50 MG TABS Take 50 mg by mouth daily.     No current facility-administered medications for this visit.     Past Surgical History:  Procedure Laterality Date   TONSILLECTOMY AND ADENOIDECTOMY       Allergies  Allergen Reactions   Lisinopril Cough   Cefuroxime Axetil Nausea And Vomiting    Upset stomach   Doxycycline Rash      Family History  Problem Relation Age of Onset   Heart attack Mother    Arrhythmia Father      Social History Arthur Le reports that he has never smoked. He has never used smokeless tobacco. Arthur Le has no history on file for alcohol use.   Review of Systems CONSTITUTIONAL: No weight loss, fever, chills, weakness or fatigue.  HEENT: Eyes: No visual loss, blurred vision, double vision or  yellow sclerae.No hearing loss, sneezing, congestion, runny nose or sore throat.  SKIN: No rash or itching.  CARDIOVASCULAR: per hpi RESPIRATORY: No shortness of breath, cough or sputum.  GASTROINTESTINAL: No anorexia, nausea, vomiting or diarrhea. No abdominal pain or blood.  GENITOURINARY: No burning on urination, no polyuria NEUROLOGICAL: No headache, dizziness, syncope, paralysis, ataxia, numbness or tingling in the extremities. No change in bowel or bladder control.  MUSCULOSKELETAL: No muscle, back pain, joint pain or stiffness.  LYMPHATICS: No enlarged nodes. No history of splenectomy.  PSYCHIATRIC: No history of depression or anxiety.  ENDOCRINOLOGIC: No reports of sweating, cold or heat intolerance. No polyuria or polydipsia.  Arthur Le   Physical Examination Today's Vitals   08/01/20 1514  BP: 112/76  Pulse: 100  SpO2: 98%  Weight: 249 lb (112.9 kg)  Height: 5\' 8"  (1.727 m)   Body mass index is 37.86 kg/m.  Gen: resting comfortably, no acute distress HEENT:  no scleral icterus, pupils equal round and reactive, no palptable cervical adenopathy,  CV: irreg, tachy, no m/r/g no jvd Resp: Clear to auscultation bilaterally GI: abdomen is soft, non-tender, non-distended, normal bowel sounds, no hepatosplenomegaly MSK: extremities are warm, no edema.  Skin: warm, no rash Neuro:  no focal deficits Psych: appropriate affect   Diagnostic Studies 02/2014 Echo Study Conclusions  - Procedure narrative: Transthoracic echocardiography for left   ventricular function evaluation, for right ventricular function   evaluation, and for assessment of valvular function. Image   quality was suboptimal, with poor valvular and endocardial   visualization. Patient was tachycardic throughout exam. - Left ventricle: There was mild concentric hypertrophy. Systolic   function was normal. The estimated ejection fraction was in the   range of 50% to 55%. Images were inadequate for LV wall motion   assessment. The study was not technically sufficient to allow   evaluation of LV diastolic dysfunction due to atrial flutter. - Mitral valve: Mildly calcified annulus. Mildly thickened leaflets   . There was trivial regurgitation. - Left atrium: The atrium was mildly dilated. - Tricuspid valve: There was trivial regurgitation.    Assessment and Plan  1. Aflutter/Afib - no recent symptoms, continue current meds including anticoag - by dynamap HR 100, EKG shows afib rate 88. Continue current meds   2. Hyperlipidemia - continue statin, labs per pcp   3. DM2 - continue ARB and statin for cardiovacular benefits in setting of DM2   4. HTN -compliant with meds, he is at goal.       Arnoldo Lenis, M.D.

## 2020-08-02 NOTE — Addendum Note (Signed)
Addended by: Merlene Laughter on: 08/02/2020 04:49 PM   Modules accepted: Orders

## 2020-08-09 DIAGNOSIS — I10 Essential (primary) hypertension: Secondary | ICD-10-CM | POA: Diagnosis not present

## 2020-08-09 DIAGNOSIS — E782 Mixed hyperlipidemia: Secondary | ICD-10-CM | POA: Diagnosis not present

## 2020-08-09 DIAGNOSIS — E1165 Type 2 diabetes mellitus with hyperglycemia: Secondary | ICD-10-CM | POA: Diagnosis not present

## 2020-08-10 DIAGNOSIS — H2513 Age-related nuclear cataract, bilateral: Secondary | ICD-10-CM | POA: Diagnosis not present

## 2020-08-10 DIAGNOSIS — H25013 Cortical age-related cataract, bilateral: Secondary | ICD-10-CM | POA: Diagnosis not present

## 2020-08-10 DIAGNOSIS — E1136 Type 2 diabetes mellitus with diabetic cataract: Secondary | ICD-10-CM | POA: Diagnosis not present

## 2020-08-10 DIAGNOSIS — H52203 Unspecified astigmatism, bilateral: Secondary | ICD-10-CM | POA: Diagnosis not present

## 2020-08-10 DIAGNOSIS — H5203 Hypermetropia, bilateral: Secondary | ICD-10-CM | POA: Diagnosis not present

## 2020-08-10 DIAGNOSIS — H524 Presbyopia: Secondary | ICD-10-CM | POA: Diagnosis not present

## 2020-08-15 DIAGNOSIS — E1165 Type 2 diabetes mellitus with hyperglycemia: Secondary | ICD-10-CM | POA: Diagnosis not present

## 2020-08-15 DIAGNOSIS — E782 Mixed hyperlipidemia: Secondary | ICD-10-CM | POA: Diagnosis not present

## 2020-08-15 DIAGNOSIS — I10 Essential (primary) hypertension: Secondary | ICD-10-CM | POA: Diagnosis not present

## 2020-08-15 DIAGNOSIS — M25511 Pain in right shoulder: Secondary | ICD-10-CM | POA: Diagnosis not present

## 2020-08-15 DIAGNOSIS — I4891 Unspecified atrial fibrillation: Secondary | ICD-10-CM | POA: Diagnosis not present

## 2020-08-15 DIAGNOSIS — M79642 Pain in left hand: Secondary | ICD-10-CM | POA: Diagnosis not present

## 2020-08-15 DIAGNOSIS — M25512 Pain in left shoulder: Secondary | ICD-10-CM | POA: Diagnosis not present

## 2020-08-15 DIAGNOSIS — M79641 Pain in right hand: Secondary | ICD-10-CM | POA: Diagnosis not present

## 2020-09-05 DIAGNOSIS — M546 Pain in thoracic spine: Secondary | ICD-10-CM | POA: Diagnosis not present

## 2020-09-05 DIAGNOSIS — M9903 Segmental and somatic dysfunction of lumbar region: Secondary | ICD-10-CM | POA: Diagnosis not present

## 2020-09-05 DIAGNOSIS — M62830 Muscle spasm of back: Secondary | ICD-10-CM | POA: Diagnosis not present

## 2020-09-05 DIAGNOSIS — M9902 Segmental and somatic dysfunction of thoracic region: Secondary | ICD-10-CM | POA: Diagnosis not present

## 2020-09-05 DIAGNOSIS — M9901 Segmental and somatic dysfunction of cervical region: Secondary | ICD-10-CM | POA: Diagnosis not present

## 2020-09-05 DIAGNOSIS — M542 Cervicalgia: Secondary | ICD-10-CM | POA: Diagnosis not present

## 2020-09-20 DIAGNOSIS — M2560 Stiffness of unspecified joint, not elsewhere classified: Secondary | ICD-10-CM | POA: Diagnosis not present

## 2020-09-20 DIAGNOSIS — R5382 Chronic fatigue, unspecified: Secondary | ICD-10-CM | POA: Diagnosis not present

## 2020-09-20 DIAGNOSIS — M25512 Pain in left shoulder: Secondary | ICD-10-CM | POA: Diagnosis not present

## 2020-09-20 DIAGNOSIS — E669 Obesity, unspecified: Secondary | ICD-10-CM | POA: Diagnosis not present

## 2020-09-20 DIAGNOSIS — Z6837 Body mass index (BMI) 37.0-37.9, adult: Secondary | ICD-10-CM | POA: Diagnosis not present

## 2020-09-20 DIAGNOSIS — M25511 Pain in right shoulder: Secondary | ICD-10-CM | POA: Diagnosis not present

## 2020-09-20 DIAGNOSIS — M79641 Pain in right hand: Secondary | ICD-10-CM | POA: Diagnosis not present

## 2020-09-20 DIAGNOSIS — M79642 Pain in left hand: Secondary | ICD-10-CM | POA: Diagnosis not present

## 2020-09-21 DIAGNOSIS — L739 Follicular disorder, unspecified: Secondary | ICD-10-CM | POA: Diagnosis not present

## 2020-09-21 DIAGNOSIS — D225 Melanocytic nevi of trunk: Secondary | ICD-10-CM | POA: Diagnosis not present

## 2020-09-21 DIAGNOSIS — Z1283 Encounter for screening for malignant neoplasm of skin: Secondary | ICD-10-CM | POA: Diagnosis not present

## 2020-09-21 DIAGNOSIS — C44311 Basal cell carcinoma of skin of nose: Secondary | ICD-10-CM | POA: Diagnosis not present

## 2020-09-21 DIAGNOSIS — Z08 Encounter for follow-up examination after completed treatment for malignant neoplasm: Secondary | ICD-10-CM | POA: Diagnosis not present

## 2020-09-21 DIAGNOSIS — Z85820 Personal history of malignant melanoma of skin: Secondary | ICD-10-CM | POA: Diagnosis not present

## 2020-09-21 DIAGNOSIS — C4441 Basal cell carcinoma of skin of scalp and neck: Secondary | ICD-10-CM | POA: Diagnosis not present

## 2020-10-03 ENCOUNTER — Telehealth: Payer: Self-pay

## 2020-10-03 MED ORDER — DILTIAZEM HCL ER COATED BEADS 120 MG PO CP24: 120.0000 mg | ORAL_CAPSULE | Freq: Every day | ORAL | 3 refills | Status: DC

## 2020-10-03 NOTE — Telephone Encounter (Signed)
Medication refill request approved for Diltiazem 120 mg tablets and sent to [facility]

## 2020-10-05 DIAGNOSIS — Z6837 Body mass index (BMI) 37.0-37.9, adult: Secondary | ICD-10-CM | POA: Diagnosis not present

## 2020-10-05 DIAGNOSIS — M25511 Pain in right shoulder: Secondary | ICD-10-CM | POA: Diagnosis not present

## 2020-10-05 DIAGNOSIS — M25512 Pain in left shoulder: Secondary | ICD-10-CM | POA: Diagnosis not present

## 2020-10-05 DIAGNOSIS — M79641 Pain in right hand: Secondary | ICD-10-CM | POA: Diagnosis not present

## 2020-10-05 DIAGNOSIS — M79642 Pain in left hand: Secondary | ICD-10-CM | POA: Diagnosis not present

## 2020-10-05 DIAGNOSIS — M2560 Stiffness of unspecified joint, not elsewhere classified: Secondary | ICD-10-CM | POA: Diagnosis not present

## 2020-10-05 DIAGNOSIS — M0609 Rheumatoid arthritis without rheumatoid factor, multiple sites: Secondary | ICD-10-CM | POA: Diagnosis not present

## 2020-10-05 DIAGNOSIS — Z85820 Personal history of malignant melanoma of skin: Secondary | ICD-10-CM | POA: Diagnosis not present

## 2020-10-05 DIAGNOSIS — R5382 Chronic fatigue, unspecified: Secondary | ICD-10-CM | POA: Diagnosis not present

## 2020-11-07 DIAGNOSIS — Z08 Encounter for follow-up examination after completed treatment for malignant neoplasm: Secondary | ICD-10-CM | POA: Diagnosis not present

## 2020-11-07 DIAGNOSIS — Z85828 Personal history of other malignant neoplasm of skin: Secondary | ICD-10-CM | POA: Diagnosis not present

## 2020-11-09 DIAGNOSIS — E782 Mixed hyperlipidemia: Secondary | ICD-10-CM | POA: Diagnosis not present

## 2020-11-09 DIAGNOSIS — E1165 Type 2 diabetes mellitus with hyperglycemia: Secondary | ICD-10-CM | POA: Diagnosis not present

## 2020-11-16 DIAGNOSIS — M25511 Pain in right shoulder: Secondary | ICD-10-CM | POA: Diagnosis not present

## 2020-11-16 DIAGNOSIS — E1165 Type 2 diabetes mellitus with hyperglycemia: Secondary | ICD-10-CM | POA: Diagnosis not present

## 2020-11-16 DIAGNOSIS — M25512 Pain in left shoulder: Secondary | ICD-10-CM | POA: Diagnosis not present

## 2020-11-16 DIAGNOSIS — I4891 Unspecified atrial fibrillation: Secondary | ICD-10-CM | POA: Diagnosis not present

## 2020-11-16 DIAGNOSIS — I10 Essential (primary) hypertension: Secondary | ICD-10-CM | POA: Diagnosis not present

## 2020-11-16 DIAGNOSIS — M79642 Pain in left hand: Secondary | ICD-10-CM | POA: Diagnosis not present

## 2020-11-16 DIAGNOSIS — E782 Mixed hyperlipidemia: Secondary | ICD-10-CM | POA: Diagnosis not present

## 2020-11-16 DIAGNOSIS — Z0001 Encounter for general adult medical examination with abnormal findings: Secondary | ICD-10-CM | POA: Diagnosis not present

## 2020-11-16 DIAGNOSIS — M79641 Pain in right hand: Secondary | ICD-10-CM | POA: Diagnosis not present

## 2020-12-05 DIAGNOSIS — M25512 Pain in left shoulder: Secondary | ICD-10-CM | POA: Diagnosis not present

## 2020-12-05 DIAGNOSIS — M79641 Pain in right hand: Secondary | ICD-10-CM | POA: Diagnosis not present

## 2020-12-05 DIAGNOSIS — R5382 Chronic fatigue, unspecified: Secondary | ICD-10-CM | POA: Diagnosis not present

## 2020-12-05 DIAGNOSIS — M62830 Muscle spasm of back: Secondary | ICD-10-CM | POA: Diagnosis not present

## 2020-12-05 DIAGNOSIS — M25511 Pain in right shoulder: Secondary | ICD-10-CM | POA: Diagnosis not present

## 2020-12-05 DIAGNOSIS — M9902 Segmental and somatic dysfunction of thoracic region: Secondary | ICD-10-CM | POA: Diagnosis not present

## 2020-12-05 DIAGNOSIS — M9901 Segmental and somatic dysfunction of cervical region: Secondary | ICD-10-CM | POA: Diagnosis not present

## 2020-12-05 DIAGNOSIS — Z6838 Body mass index (BMI) 38.0-38.9, adult: Secondary | ICD-10-CM | POA: Diagnosis not present

## 2020-12-05 DIAGNOSIS — Z85820 Personal history of malignant melanoma of skin: Secondary | ICD-10-CM | POA: Diagnosis not present

## 2020-12-05 DIAGNOSIS — M79642 Pain in left hand: Secondary | ICD-10-CM | POA: Diagnosis not present

## 2020-12-05 DIAGNOSIS — M9903 Segmental and somatic dysfunction of lumbar region: Secondary | ICD-10-CM | POA: Diagnosis not present

## 2020-12-05 DIAGNOSIS — M2560 Stiffness of unspecified joint, not elsewhere classified: Secondary | ICD-10-CM | POA: Diagnosis not present

## 2020-12-05 DIAGNOSIS — M0609 Rheumatoid arthritis without rheumatoid factor, multiple sites: Secondary | ICD-10-CM | POA: Diagnosis not present

## 2020-12-05 DIAGNOSIS — M546 Pain in thoracic spine: Secondary | ICD-10-CM | POA: Diagnosis not present

## 2020-12-05 DIAGNOSIS — M542 Cervicalgia: Secondary | ICD-10-CM | POA: Diagnosis not present

## 2020-12-19 DIAGNOSIS — C4362 Malignant melanoma of left upper limb, including shoulder: Secondary | ICD-10-CM | POA: Diagnosis not present

## 2021-01-31 DIAGNOSIS — M9901 Segmental and somatic dysfunction of cervical region: Secondary | ICD-10-CM | POA: Diagnosis not present

## 2021-01-31 DIAGNOSIS — M9902 Segmental and somatic dysfunction of thoracic region: Secondary | ICD-10-CM | POA: Diagnosis not present

## 2021-01-31 DIAGNOSIS — M62830 Muscle spasm of back: Secondary | ICD-10-CM | POA: Diagnosis not present

## 2021-01-31 DIAGNOSIS — M9903 Segmental and somatic dysfunction of lumbar region: Secondary | ICD-10-CM | POA: Diagnosis not present

## 2021-01-31 DIAGNOSIS — M542 Cervicalgia: Secondary | ICD-10-CM | POA: Diagnosis not present

## 2021-01-31 DIAGNOSIS — M546 Pain in thoracic spine: Secondary | ICD-10-CM | POA: Diagnosis not present

## 2021-02-03 DIAGNOSIS — E782 Mixed hyperlipidemia: Secondary | ICD-10-CM | POA: Diagnosis not present

## 2021-02-03 DIAGNOSIS — I10 Essential (primary) hypertension: Secondary | ICD-10-CM | POA: Diagnosis not present

## 2021-02-16 DIAGNOSIS — B078 Other viral warts: Secondary | ICD-10-CM | POA: Diagnosis not present

## 2021-02-16 DIAGNOSIS — D225 Melanocytic nevi of trunk: Secondary | ICD-10-CM | POA: Diagnosis not present

## 2021-02-16 DIAGNOSIS — Z85820 Personal history of malignant melanoma of skin: Secondary | ICD-10-CM | POA: Diagnosis not present

## 2021-02-16 DIAGNOSIS — L905 Scar conditions and fibrosis of skin: Secondary | ICD-10-CM | POA: Diagnosis not present

## 2021-02-16 DIAGNOSIS — X32XXXD Exposure to sunlight, subsequent encounter: Secondary | ICD-10-CM | POA: Diagnosis not present

## 2021-02-16 DIAGNOSIS — Z08 Encounter for follow-up examination after completed treatment for malignant neoplasm: Secondary | ICD-10-CM | POA: Diagnosis not present

## 2021-02-16 DIAGNOSIS — Z1283 Encounter for screening for malignant neoplasm of skin: Secondary | ICD-10-CM | POA: Diagnosis not present

## 2021-02-16 DIAGNOSIS — L570 Actinic keratosis: Secondary | ICD-10-CM | POA: Diagnosis not present

## 2021-02-21 ENCOUNTER — Encounter: Payer: Self-pay | Admitting: Cardiology

## 2021-02-24 DIAGNOSIS — M546 Pain in thoracic spine: Secondary | ICD-10-CM | POA: Diagnosis not present

## 2021-02-24 DIAGNOSIS — M62830 Muscle spasm of back: Secondary | ICD-10-CM | POA: Diagnosis not present

## 2021-02-24 DIAGNOSIS — M9902 Segmental and somatic dysfunction of thoracic region: Secondary | ICD-10-CM | POA: Diagnosis not present

## 2021-02-24 DIAGNOSIS — M9903 Segmental and somatic dysfunction of lumbar region: Secondary | ICD-10-CM | POA: Diagnosis not present

## 2021-02-24 DIAGNOSIS — M542 Cervicalgia: Secondary | ICD-10-CM | POA: Diagnosis not present

## 2021-02-24 DIAGNOSIS — M9901 Segmental and somatic dysfunction of cervical region: Secondary | ICD-10-CM | POA: Diagnosis not present

## 2021-02-28 ENCOUNTER — Other Ambulatory Visit: Payer: Self-pay | Admitting: *Deleted

## 2021-02-28 MED ORDER — APIXABAN 5 MG PO TABS: 5.0000 mg | ORAL_TABLET | Freq: Two times a day (BID) | ORAL | 3 refills | Status: DC

## 2021-03-09 DIAGNOSIS — E669 Obesity, unspecified: Secondary | ICD-10-CM | POA: Diagnosis not present

## 2021-03-09 DIAGNOSIS — R5382 Chronic fatigue, unspecified: Secondary | ICD-10-CM | POA: Diagnosis not present

## 2021-03-09 DIAGNOSIS — M0609 Rheumatoid arthritis without rheumatoid factor, multiple sites: Secondary | ICD-10-CM | POA: Diagnosis not present

## 2021-03-09 DIAGNOSIS — Z6838 Body mass index (BMI) 38.0-38.9, adult: Secondary | ICD-10-CM | POA: Diagnosis not present

## 2021-03-09 DIAGNOSIS — Z85820 Personal history of malignant melanoma of skin: Secondary | ICD-10-CM | POA: Diagnosis not present

## 2021-03-17 ENCOUNTER — Telehealth: Payer: Self-pay | Admitting: Cardiology

## 2021-03-17 ENCOUNTER — Other Ambulatory Visit: Payer: Self-pay | Admitting: *Deleted

## 2021-03-17 MED ORDER — APIXABAN 5 MG PO TABS: 5.0000 mg | ORAL_TABLET | Freq: Two times a day (BID) | ORAL | 3 refills | Status: DC

## 2021-03-17 NOTE — Telephone Encounter (Signed)
Patient returned call, transferred to RN.  

## 2021-03-17 NOTE — Telephone Encounter (Signed)
Informed patient - per [employer] - his 3% out of pocket amount is $21.96.  He will bring new pharmacy expenses to [place] office after he has obtained.

## 2021-03-28 DIAGNOSIS — E1165 Type 2 diabetes mellitus with hyperglycemia: Secondary | ICD-10-CM | POA: Diagnosis not present

## 2021-03-28 DIAGNOSIS — E782 Mixed hyperlipidemia: Secondary | ICD-10-CM | POA: Diagnosis not present

## 2021-03-29 DIAGNOSIS — E1165 Type 2 diabetes mellitus with hyperglycemia: Secondary | ICD-10-CM | POA: Diagnosis not present

## 2021-03-29 DIAGNOSIS — M79641 Pain in right hand: Secondary | ICD-10-CM | POA: Diagnosis not present

## 2021-03-29 DIAGNOSIS — I4891 Unspecified atrial fibrillation: Secondary | ICD-10-CM | POA: Diagnosis not present

## 2021-03-29 DIAGNOSIS — E782 Mixed hyperlipidemia: Secondary | ICD-10-CM | POA: Diagnosis not present

## 2021-03-29 DIAGNOSIS — M25512 Pain in left shoulder: Secondary | ICD-10-CM | POA: Diagnosis not present

## 2021-03-29 DIAGNOSIS — M79642 Pain in left hand: Secondary | ICD-10-CM | POA: Diagnosis not present

## 2021-03-29 DIAGNOSIS — I10 Essential (primary) hypertension: Secondary | ICD-10-CM | POA: Diagnosis not present

## 2021-03-29 DIAGNOSIS — M25511 Pain in right shoulder: Secondary | ICD-10-CM | POA: Diagnosis not present

## 2021-04-13 ENCOUNTER — Telehealth: Payer: Self-pay | Admitting: *Deleted

## 2021-04-13 NOTE — Telephone Encounter (Signed)
Fax received from [facility] - Eliquis approved free of charge from 04/12/2021 through 02/04/2022.   ?

## 2021-05-10 DIAGNOSIS — M9902 Segmental and somatic dysfunction of thoracic region: Secondary | ICD-10-CM | POA: Diagnosis not present

## 2021-05-10 DIAGNOSIS — M62830 Muscle spasm of back: Secondary | ICD-10-CM | POA: Diagnosis not present

## 2021-05-10 DIAGNOSIS — M546 Pain in thoracic spine: Secondary | ICD-10-CM | POA: Diagnosis not present

## 2021-05-10 DIAGNOSIS — M9901 Segmental and somatic dysfunction of cervical region: Secondary | ICD-10-CM | POA: Diagnosis not present

## 2021-05-10 DIAGNOSIS — M542 Cervicalgia: Secondary | ICD-10-CM | POA: Diagnosis not present

## 2021-05-10 DIAGNOSIS — M9903 Segmental and somatic dysfunction of lumbar region: Secondary | ICD-10-CM | POA: Diagnosis not present

## 2021-05-31 ENCOUNTER — Other Ambulatory Visit: Payer: Self-pay | Admitting: Cardiology

## 2021-06-14 DIAGNOSIS — C4362 Malignant melanoma of left upper limb, including shoulder: Secondary | ICD-10-CM | POA: Diagnosis not present

## 2021-07-07 DIAGNOSIS — Z79899 Other long term (current) drug therapy: Secondary | ICD-10-CM | POA: Diagnosis not present

## 2021-07-07 DIAGNOSIS — Z85820 Personal history of malignant melanoma of skin: Secondary | ICD-10-CM | POA: Diagnosis not present

## 2021-07-07 DIAGNOSIS — M0609 Rheumatoid arthritis without rheumatoid factor, multiple sites: Secondary | ICD-10-CM | POA: Diagnosis not present

## 2021-07-07 DIAGNOSIS — E669 Obesity, unspecified: Secondary | ICD-10-CM | POA: Diagnosis not present

## 2021-07-07 DIAGNOSIS — R5382 Chronic fatigue, unspecified: Secondary | ICD-10-CM | POA: Diagnosis not present

## 2021-07-07 DIAGNOSIS — Z6839 Body mass index (BMI) 39.0-39.9, adult: Secondary | ICD-10-CM | POA: Diagnosis not present

## 2021-07-07 DIAGNOSIS — E782 Mixed hyperlipidemia: Secondary | ICD-10-CM | POA: Diagnosis not present

## 2021-07-14 DIAGNOSIS — M546 Pain in thoracic spine: Secondary | ICD-10-CM | POA: Diagnosis not present

## 2021-07-14 DIAGNOSIS — M9903 Segmental and somatic dysfunction of lumbar region: Secondary | ICD-10-CM | POA: Diagnosis not present

## 2021-07-14 DIAGNOSIS — M542 Cervicalgia: Secondary | ICD-10-CM | POA: Diagnosis not present

## 2021-07-14 DIAGNOSIS — M62830 Muscle spasm of back: Secondary | ICD-10-CM | POA: Diagnosis not present

## 2021-07-14 DIAGNOSIS — M9901 Segmental and somatic dysfunction of cervical region: Secondary | ICD-10-CM | POA: Diagnosis not present

## 2021-07-14 DIAGNOSIS — M9902 Segmental and somatic dysfunction of thoracic region: Secondary | ICD-10-CM | POA: Diagnosis not present

## 2021-08-04 DIAGNOSIS — E1165 Type 2 diabetes mellitus with hyperglycemia: Secondary | ICD-10-CM | POA: Diagnosis not present

## 2021-08-04 DIAGNOSIS — I10 Essential (primary) hypertension: Secondary | ICD-10-CM | POA: Diagnosis not present

## 2021-08-04 DIAGNOSIS — I4891 Unspecified atrial fibrillation: Secondary | ICD-10-CM | POA: Diagnosis not present

## 2021-08-04 DIAGNOSIS — E782 Mixed hyperlipidemia: Secondary | ICD-10-CM | POA: Diagnosis not present

## 2021-08-09 ENCOUNTER — Other Ambulatory Visit: Payer: Self-pay | Admitting: Cardiology

## 2021-08-09 DIAGNOSIS — E1165 Type 2 diabetes mellitus with hyperglycemia: Secondary | ICD-10-CM | POA: Diagnosis not present

## 2021-08-09 DIAGNOSIS — E782 Mixed hyperlipidemia: Secondary | ICD-10-CM | POA: Diagnosis not present

## 2021-08-15 ENCOUNTER — Encounter: Payer: Self-pay | Admitting: Cardiology

## 2021-08-15 ENCOUNTER — Ambulatory Visit: Payer: Medicare HMO | Admitting: Cardiology

## 2021-08-15 VITALS — BP 130/75 | HR 101 | Ht 68.0 in | Wt 259.0 lb

## 2021-08-15 DIAGNOSIS — M25512 Pain in left shoulder: Secondary | ICD-10-CM | POA: Diagnosis not present

## 2021-08-15 DIAGNOSIS — E782 Mixed hyperlipidemia: Secondary | ICD-10-CM | POA: Diagnosis not present

## 2021-08-15 DIAGNOSIS — I4891 Unspecified atrial fibrillation: Secondary | ICD-10-CM | POA: Diagnosis not present

## 2021-08-15 DIAGNOSIS — E1165 Type 2 diabetes mellitus with hyperglycemia: Secondary | ICD-10-CM | POA: Diagnosis not present

## 2021-08-15 DIAGNOSIS — E119 Type 2 diabetes mellitus without complications: Secondary | ICD-10-CM

## 2021-08-15 DIAGNOSIS — M79641 Pain in right hand: Secondary | ICD-10-CM | POA: Diagnosis not present

## 2021-08-15 DIAGNOSIS — I10 Essential (primary) hypertension: Secondary | ICD-10-CM | POA: Diagnosis not present

## 2021-08-15 DIAGNOSIS — M79642 Pain in left hand: Secondary | ICD-10-CM | POA: Diagnosis not present

## 2021-08-15 DIAGNOSIS — M25511 Pain in right shoulder: Secondary | ICD-10-CM | POA: Diagnosis not present

## 2021-08-15 NOTE — Progress Notes (Signed)
Clinical Summary [name] is a 71 y.o.male seen today for follow up of the following medical problems.      1. Aflutter/Afib - CHADS2Vasc score is 3, he is on eliquis.  - no recent palpitaitons - compliant with meds - no bleeding on eliquis.        2. Hyperlipidemia     - muscle aches on zetia, he has stopped taking - taking atorvastatin, recent labs with pcp     3. HTN -he is compliant with meds    4. LE edema - rarely needs prn lasix.  - no recent issues, rarely take prn lasix.      5. 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 180 tablet 3   atorvastatin (LIPITOR) 40 MG tablet Take 40 mg by mouth daily.     Cholecalciferol (VITAMIN D-3 PO) Take 5,000 Int'l Units by mouth.     Coenzyme Q10 (COQ-10) 400 MG CAPS Take 1 capsule by mouth daily.     diltiazem (CARDIZEM CD) 120 MG 24 hr capsule TAKE 1 CAPSULE EVERY DAY 90 capsule 3   furosemide (LASIX) 20 MG tablet Take 20 mg by mouth as needed.     hydroxychloroquine (PLAQUENIL) 200 MG tablet Take 200 mg by mouth 2 (two) times daily.     Lecithin 1200 MG CAPS Take 1,200 mg by mouth daily.     loratadine (CLARITIN) 10 MG tablet Take 10 mg by mouth every other day.     losartan (COZAAR) 25 MG tablet TAKE 1/2 TABLET EVERY DAY 45 tablet 3   metFORMIN (GLUCOPHAGE) 500 MG tablet Take 500 mg by mouth 2 (two) times daily with a meal.     metoprolol tartrate (LOPRESSOR) 100 MG tablet TAKE 1 TABLET TWICE DAILY 180  tablet 1   Misc Natural Products (GINKOGIN PO) Take 240 mg by mouth daily.     Omega-3 Fatty Acids (FISH OIL) 1000 MG CAPS Take 1,000 mg by mouth daily.     omeprazole (PRILOSEC) 20 MG capsule Take 20 mg by mouth daily.     TURMERIC PO Take by mouth.     UNABLE TO FIND Nitrite oxide booster, 1600 mg once a day     vitamin C (ASCORBIC ACID) 500 MG tablet Take 500 mg by mouth daily.     Zinc 50 MG TABS Take 50 mg by mouth daily.     No current facility-administered medications for this visit.     Past Surgical History:  Procedure Laterality Date   TONSILLECTOMY AND ADENOIDECTOMY       Allergies  Allergen Reactions   Ezetimibe     Joint pain   Lisinopril Cough   Cefuroxime Axetil Nausea And Vomiting    Upset stomach   Doxycycline Rash      Family History  Problem Relation Age of Onset   Heart attack Mother    Arrhythmia  Father      Social History [name] reports that he has never smoked. He has never used smokeless tobacco. [name] reports no history of alcohol use.   Review of Systems CONSTITUTIONAL: No weight loss, fever, chills, weakness or fatigue.  HEENT: Eyes: No visual loss, blurred vision, double vision or yellow sclerae.No hearing loss, sneezing, congestion, runny nose or sore throat.  SKIN: No rash or itching.  CARDIOVASCULAR: per hpi RESPIRATORY: No shortness of breath, cough or sputum.  GASTROINTESTINAL: No anorexia, nausea, vomiting or diarrhea. No abdominal pain or blood.  GENITOURINARY: No burning on urination, no polyuria NEUROLOGICAL: No headache, dizziness, syncope, paralysis, ataxia, numbness or tingling in the extremities. No change in bowel or bladder control.  MUSCULOSKELETAL: No muscle, back pain, joint pain or stiffness.  LYMPHATICS: No enlarged nodes. No history of splenectomy.  PSYCHIATRIC: No history of depression or anxiety.  ENDOCRINOLOGIC: No reports of sweating, cold or heat intolerance. No polyuria or polydipsia.   Marland Kitchen   Physical Examination Today's Vitals   08/15/21 0921 08/15/21 0952  BP: 132/88 130/75  Pulse: (!) 101   SpO2: 97%   Weight: 259 lb (117.5 kg)   Height: '5\' 8"'$  (1.727 m)    Body mass index is 39.38 kg/m.  Gen: resting comfortably, no acute distress HEENT: no scleral icterus, pupils equal round and reactive, no palptable cervical adenopathy,  CV: irreg, no mr/g, no jvd Resp: Clear to auscultation bilaterally GI: abdomen is soft, non-tender, non-distended, normal bowel sounds, no hepatosplenomegaly MSK: extremities are warm, no edema.  Skin: warm, no rash Neuro:  no focal deficits Psych: appropriate affect   Diagnostic Studies 02/2014 Echo Study Conclusions  - Procedure narrative: Transthoracic echocardiography for left   ventricular function evaluation, for right ventricular function   evaluation, and for assessment of valvular function. Image   quality was suboptimal, with poor valvular and endocardial   visualization. Patient was tachycardic throughout exam. - Left ventricle: There was mild concentric hypertrophy. Systolic   function was normal. The estimated ejection fraction was in the   range of 50% to 55%. Images were inadequate for LV wall motion   assessment. The study was not technically sufficient to allow   evaluation of LV diastolic dysfunction due to atrial flutter. - Mitral valve: Mildly calcified annulus. Mildly thickened leaflets   . There was trivial regurgitation. - Left atrium: The atrium was mildly dilated. - Tricuspid valve: There was trivial regurgitation.    Assessment and Plan   1. Aflutter/Afib/acquired thrombophilia - no symptoms, EKG today shows rate controlled afib in the 70s - continue current meds including eliquis for stroke prvention   2. Hyperlipidemia - request labs from pcp, continue current meds   3. DM2 - glycemic control per pcp, from cardiac standpoint continue ARB and statin.    4. HTN At goal on manual recheck,  continue current meds   F/u 1 year     Arnoldo Lenis, M.D

## 2021-08-15 NOTE — Patient Instructions (Signed)

## 2021-08-17 ENCOUNTER — Encounter: Payer: Self-pay | Admitting: *Deleted

## 2021-08-17 DIAGNOSIS — Z79899 Other long term (current) drug therapy: Secondary | ICD-10-CM | POA: Diagnosis not present

## 2021-08-17 DIAGNOSIS — H5203 Hypermetropia, bilateral: Secondary | ICD-10-CM | POA: Diagnosis not present

## 2021-08-17 DIAGNOSIS — H25813 Combined forms of age-related cataract, bilateral: Secondary | ICD-10-CM | POA: Diagnosis not present

## 2021-08-17 DIAGNOSIS — H524 Presbyopia: Secondary | ICD-10-CM | POA: Diagnosis not present

## 2021-08-17 DIAGNOSIS — H52203 Unspecified astigmatism, bilateral: Secondary | ICD-10-CM | POA: Diagnosis not present

## 2021-08-17 DIAGNOSIS — E1136 Type 2 diabetes mellitus with diabetic cataract: Secondary | ICD-10-CM | POA: Diagnosis not present

## 2021-08-17 DIAGNOSIS — Z1283 Encounter for screening for malignant neoplasm of skin: Secondary | ICD-10-CM | POA: Diagnosis not present

## 2021-08-17 DIAGNOSIS — Z85820 Personal history of malignant melanoma of skin: Secondary | ICD-10-CM | POA: Diagnosis not present

## 2021-08-17 DIAGNOSIS — Z08 Encounter for follow-up examination after completed treatment for malignant neoplasm: Secondary | ICD-10-CM | POA: Diagnosis not present

## 2021-08-17 DIAGNOSIS — M069 Rheumatoid arthritis, unspecified: Secondary | ICD-10-CM | POA: Diagnosis not present

## 2021-08-17 DIAGNOSIS — B078 Other viral warts: Secondary | ICD-10-CM | POA: Diagnosis not present

## 2021-08-17 DIAGNOSIS — D225 Melanocytic nevi of trunk: Secondary | ICD-10-CM | POA: Diagnosis not present

## 2021-10-03 DIAGNOSIS — M9902 Segmental and somatic dysfunction of thoracic region: Secondary | ICD-10-CM | POA: Diagnosis not present

## 2021-10-03 DIAGNOSIS — M546 Pain in thoracic spine: Secondary | ICD-10-CM | POA: Diagnosis not present

## 2021-10-03 DIAGNOSIS — M9901 Segmental and somatic dysfunction of cervical region: Secondary | ICD-10-CM | POA: Diagnosis not present

## 2021-10-03 DIAGNOSIS — M542 Cervicalgia: Secondary | ICD-10-CM | POA: Diagnosis not present

## 2021-10-03 DIAGNOSIS — M9903 Segmental and somatic dysfunction of lumbar region: Secondary | ICD-10-CM | POA: Diagnosis not present

## 2021-11-16 DIAGNOSIS — X32XXXD Exposure to sunlight, subsequent encounter: Secondary | ICD-10-CM | POA: Diagnosis not present

## 2021-11-16 DIAGNOSIS — L570 Actinic keratosis: Secondary | ICD-10-CM | POA: Diagnosis not present

## 2021-11-16 DIAGNOSIS — L820 Inflamed seborrheic keratosis: Secondary | ICD-10-CM | POA: Diagnosis not present

## 2021-11-29 DIAGNOSIS — E782 Mixed hyperlipidemia: Secondary | ICD-10-CM | POA: Diagnosis not present

## 2021-11-29 DIAGNOSIS — E1165 Type 2 diabetes mellitus with hyperglycemia: Secondary | ICD-10-CM | POA: Diagnosis not present

## 2021-12-05 ENCOUNTER — Encounter: Payer: Self-pay | Admitting: Internal Medicine

## 2021-12-05 DIAGNOSIS — E782 Mixed hyperlipidemia: Secondary | ICD-10-CM | POA: Diagnosis not present

## 2021-12-05 DIAGNOSIS — M25512 Pain in left shoulder: Secondary | ICD-10-CM | POA: Diagnosis not present

## 2021-12-05 DIAGNOSIS — E1165 Type 2 diabetes mellitus with hyperglycemia: Secondary | ICD-10-CM | POA: Diagnosis not present

## 2021-12-05 DIAGNOSIS — I4891 Unspecified atrial fibrillation: Secondary | ICD-10-CM | POA: Diagnosis not present

## 2021-12-05 DIAGNOSIS — M79641 Pain in right hand: Secondary | ICD-10-CM | POA: Diagnosis not present

## 2021-12-05 DIAGNOSIS — M25511 Pain in right shoulder: Secondary | ICD-10-CM | POA: Diagnosis not present

## 2021-12-05 DIAGNOSIS — I10 Essential (primary) hypertension: Secondary | ICD-10-CM | POA: Diagnosis not present

## 2021-12-05 DIAGNOSIS — M79642 Pain in left hand: Secondary | ICD-10-CM | POA: Diagnosis not present

## 2022-01-18 DIAGNOSIS — M0609 Rheumatoid arthritis without rheumatoid factor, multiple sites: Secondary | ICD-10-CM | POA: Diagnosis not present

## 2022-01-18 DIAGNOSIS — R5382 Chronic fatigue, unspecified: Secondary | ICD-10-CM | POA: Diagnosis not present

## 2022-01-18 DIAGNOSIS — E669 Obesity, unspecified: Secondary | ICD-10-CM | POA: Diagnosis not present

## 2022-01-18 DIAGNOSIS — Z79899 Other long term (current) drug therapy: Secondary | ICD-10-CM | POA: Diagnosis not present

## 2022-01-18 DIAGNOSIS — Z85820 Personal history of malignant melanoma of skin: Secondary | ICD-10-CM | POA: Diagnosis not present

## 2022-01-18 DIAGNOSIS — Z6839 Body mass index (BMI) 39.0-39.9, adult: Secondary | ICD-10-CM | POA: Diagnosis not present

## 2022-01-25 ENCOUNTER — Other Ambulatory Visit: Payer: Self-pay | Admitting: *Deleted

## 2022-01-25 MED ORDER — APIXABAN 5 MG PO TABS: 5.0000 mg | ORAL_TABLET | Freq: Two times a day (BID) | ORAL | 3 refills | Status: DC

## 2022-01-26 DIAGNOSIS — M542 Cervicalgia: Secondary | ICD-10-CM | POA: Diagnosis not present

## 2022-01-26 DIAGNOSIS — M9903 Segmental and somatic dysfunction of lumbar region: Secondary | ICD-10-CM | POA: Diagnosis not present

## 2022-01-26 DIAGNOSIS — M9902 Segmental and somatic dysfunction of thoracic region: Secondary | ICD-10-CM | POA: Diagnosis not present

## 2022-01-26 DIAGNOSIS — M9901 Segmental and somatic dysfunction of cervical region: Secondary | ICD-10-CM | POA: Diagnosis not present

## 2022-01-26 DIAGNOSIS — M546 Pain in thoracic spine: Secondary | ICD-10-CM | POA: Diagnosis not present

## 2022-02-15 DIAGNOSIS — Z1283 Encounter for screening for malignant neoplasm of skin: Secondary | ICD-10-CM | POA: Diagnosis not present

## 2022-02-15 DIAGNOSIS — D225 Melanocytic nevi of trunk: Secondary | ICD-10-CM | POA: Diagnosis not present

## 2022-02-15 DIAGNOSIS — L820 Inflamed seborrheic keratosis: Secondary | ICD-10-CM | POA: Diagnosis not present

## 2022-02-15 DIAGNOSIS — X32XXXD Exposure to sunlight, subsequent encounter: Secondary | ICD-10-CM | POA: Diagnosis not present

## 2022-02-15 DIAGNOSIS — Z08 Encounter for follow-up examination after completed treatment for malignant neoplasm: Secondary | ICD-10-CM | POA: Diagnosis not present

## 2022-02-15 DIAGNOSIS — L570 Actinic keratosis: Secondary | ICD-10-CM | POA: Diagnosis not present

## 2022-02-15 DIAGNOSIS — Z85820 Personal history of malignant melanoma of skin: Secondary | ICD-10-CM | POA: Diagnosis not present

## 2022-03-01 ENCOUNTER — Encounter: Payer: Self-pay | Admitting: *Deleted

## 2022-04-05 ENCOUNTER — Encounter: Payer: Self-pay | Admitting: Radiology

## 2022-04-10 ENCOUNTER — Encounter: Payer: Self-pay | Admitting: *Deleted

## 2022-04-11 ENCOUNTER — Other Ambulatory Visit: Payer: Self-pay | Admitting: Cardiology

## 2022-04-16 ENCOUNTER — Other Ambulatory Visit: Payer: Self-pay | Admitting: *Deleted

## 2022-04-16 MED ORDER — APIXABAN 5 MG PO TABS: 5.0000 mg | ORAL_TABLET | Freq: Two times a day (BID) | ORAL | 3 refills | Status: DC

## 2022-04-18 DIAGNOSIS — E1165 Type 2 diabetes mellitus with hyperglycemia: Secondary | ICD-10-CM | POA: Diagnosis not present

## 2022-04-18 DIAGNOSIS — E782 Mixed hyperlipidemia: Secondary | ICD-10-CM | POA: Diagnosis not present

## 2022-04-25 ENCOUNTER — Encounter: Payer: Self-pay | Admitting: Internal Medicine

## 2022-04-25 DIAGNOSIS — M25512 Pain in left shoulder: Secondary | ICD-10-CM | POA: Diagnosis not present

## 2022-04-25 DIAGNOSIS — E782 Mixed hyperlipidemia: Secondary | ICD-10-CM | POA: Diagnosis not present

## 2022-04-25 DIAGNOSIS — I4891 Unspecified atrial fibrillation: Secondary | ICD-10-CM | POA: Diagnosis not present

## 2022-04-25 DIAGNOSIS — M25511 Pain in right shoulder: Secondary | ICD-10-CM | POA: Diagnosis not present

## 2022-04-25 DIAGNOSIS — M79642 Pain in left hand: Secondary | ICD-10-CM | POA: Diagnosis not present

## 2022-04-25 DIAGNOSIS — E1169 Type 2 diabetes mellitus with other specified complication: Secondary | ICD-10-CM | POA: Diagnosis not present

## 2022-04-25 DIAGNOSIS — M79641 Pain in right hand: Secondary | ICD-10-CM | POA: Diagnosis not present

## 2022-04-25 DIAGNOSIS — I10 Essential (primary) hypertension: Secondary | ICD-10-CM | POA: Diagnosis not present

## 2022-04-25 DIAGNOSIS — E1165 Type 2 diabetes mellitus with hyperglycemia: Secondary | ICD-10-CM | POA: Diagnosis not present

## 2022-05-10 ENCOUNTER — Ambulatory Visit: Payer: Medicare HMO | Admitting: Cardiology

## 2022-05-10 NOTE — Progress Notes (Unsigned)
Clinical Summary [name] is a 72 y.o.male  seen today for follow up of the following medical problems.      1. Aflutter/Afib - CHADS2Vasc score is 3, he is on eliquis.  - no recent palpitaitons - compliant with meds - no bleeding on eliquis.         2. Hyperlipidemia     - muscle aches on zetia, he has stopped taking - taking atorvastatin, recent labs with pcp     3. HTN -he is compliant with meds    4. LE edema - rarely needs prn lasix.  - no recent issues, rarely take prn lasix.      5. 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 180 tablet 3   atorvastatin (LIPITOR) 40 MG tablet Take 40 mg by mouth daily.     Cholecalciferol (VITAMIN D-3 PO) Take 5,000 Int'l Units by mouth.     Coenzyme Q10 (COQ-10) 400 MG CAPS Take 1 capsule by mouth daily.     diltiazem (CARDIZEM CD) 120 MG 24 hr capsule TAKE 1 CAPSULE EVERY DAY 90 capsule 3   furosemide (LASIX) 20 MG tablet Take 20 mg by mouth as needed.     hydroxychloroquine (PLAQUENIL) 200 MG tablet Take 200 mg by mouth 2 (two) times daily.     Lecithin 1200 MG CAPS Take 1,200 mg by mouth daily.     loratadine (CLARITIN) 10 MG tablet Take 10 mg by mouth every other day.     losartan (COZAAR) 25 MG tablet TAKE 1/2 TABLET EVERY DAY 45 tablet 3   metFORMIN (GLUCOPHAGE) 500 MG tablet Take 500 mg by mouth 2 (two) times daily with a meal.     metoprolol tartrate (LOPRESSOR) 100 MG tablet TAKE 1 TABLET TWICE DAILY 180  tablet 3   Misc Natural Products (GINKOGIN PO) Take 240 mg by mouth daily.     Omega-3 Fatty Acids (FISH OIL) 1000 MG CAPS Take 1,000 mg by mouth daily.     omeprazole (PRILOSEC) 20 MG capsule Take 20 mg by mouth daily.     TURMERIC PO Take by mouth.     UNABLE TO FIND Nitrite oxide booster, 1600 mg once a day     vitamin C (ASCORBIC ACID) 500 MG tablet Take 500 mg by mouth daily.     Zinc 50 MG TABS Take 50 mg by mouth daily.     No current facility-administered medications for this visit.     Past Surgical History:  Procedure Laterality Date   TONSILLECTOMY AND ADENOIDECTOMY       Allergies  Allergen Reactions   Ezetimibe     Joint pain   Lisinopril Cough   Cefuroxime Axetil Nausea And Vomiting    Upset stomach   Doxycycline Rash      Family History  Problem Relation Age of Onset   Heart attack Mother  Arrhythmia Father      Social History Mr. Doctor reports that he has never smoked. He has never used smokeless tobacco. Mr. Jewart reports no history of alcohol use.   Review of Systems CONSTITUTIONAL: No weight loss, fever, chills, weakness or fatigue.  HEENT: Eyes: No visual loss, blurred vision, double vision or yellow sclerae.No hearing loss, sneezing, congestion, runny nose or sore throat.  SKIN: No rash or itching.  CARDIOVASCULAR:  RESPIRATORY: No shortness of breath, cough or sputum.  GASTROINTESTINAL: No anorexia, nausea, vomiting or diarrhea. No abdominal pain or blood.  GENITOURINARY: No burning on urination, no polyuria NEUROLOGICAL: No headache, dizziness, syncope, paralysis, ataxia, numbness or tingling in the extremities. No change in bowel or bladder control.  MUSCULOSKELETAL: No muscle, back pain, joint pain or stiffness.  LYMPHATICS: No enlarged nodes. No history of splenectomy.  PSYCHIATRIC: No history of depression or anxiety.  ENDOCRINOLOGIC: No reports of sweating, cold or heat intolerance. No polyuria or polydipsia.  Marland Kitchen   Physical  Examination There were no vitals filed for this visit. There were no vitals filed for this visit.  Gen: resting comfortably, no acute distress HEENT: no scleral icterus, pupils equal round and reactive, no palptable cervical adenopathy,  CV Resp: Clear to auscultation bilaterally GI: abdomen is soft, non-tender, non-distended, normal bowel sounds, no hepatosplenomegaly MSK: extremities are warm, no edema.  Skin: warm, no rash Neuro:  no focal deficits Psych: appropriate affect   Diagnostic Studies 02/2014 Echo Study Conclusions  - Procedure narrative: Transthoracic echocardiography for left   ventricular function evaluation, for right ventricular function   evaluation, and for assessment of valvular function. Image   quality was suboptimal, with poor valvular and endocardial   visualization. Patient was tachycardic throughout exam. - Left ventricle: There was mild concentric hypertrophy. Systolic   function was normal. The estimated ejection fraction was in the   range of 50% to 55%. Images were inadequate for LV wall motion   assessment. The study was not technically sufficient to allow   evaluation of LV diastolic dysfunction due to atrial flutter. - Mitral valve: Mildly calcified annulus. Mildly thickened leaflets   . There was trivial regurgitation. - Left atrium: The atrium was mildly dilated. - Tricuspid valve: There was trivial regurgitation.      Assessment and Plan  1. Aflutter/Afib/acquired thrombophilia - no symptoms, EKG today shows rate controlled afib in the 70s - continue current meds including eliquis for stroke prvention   2. Hyperlipidemia - request labs from pcp, continue current meds   3. DM2 - glycemic control per pcp, from cardiac standpoint continue ARB and statin.    4. HTN At goal on manual recheck, continue current meds      Arnoldo Lenis, M.D.

## 2022-05-25 DIAGNOSIS — M542 Cervicalgia: Secondary | ICD-10-CM | POA: Diagnosis not present

## 2022-05-25 DIAGNOSIS — M9901 Segmental and somatic dysfunction of cervical region: Secondary | ICD-10-CM | POA: Diagnosis not present

## 2022-05-25 DIAGNOSIS — M546 Pain in thoracic spine: Secondary | ICD-10-CM | POA: Diagnosis not present

## 2022-05-25 DIAGNOSIS — M9903 Segmental and somatic dysfunction of lumbar region: Secondary | ICD-10-CM | POA: Diagnosis not present

## 2022-05-25 DIAGNOSIS — M9902 Segmental and somatic dysfunction of thoracic region: Secondary | ICD-10-CM | POA: Diagnosis not present

## 2022-06-19 ENCOUNTER — Encounter: Payer: Self-pay | Admitting: Cardiology

## 2022-06-19 ENCOUNTER — Ambulatory Visit: Payer: Medicare HMO | Attending: Cardiology | Admitting: Cardiology

## 2022-06-19 VITALS — BP 128/85 | HR 91 | Ht 68.0 in | Wt 257.0 lb

## 2022-06-19 DIAGNOSIS — I10 Essential (primary) hypertension: Secondary | ICD-10-CM | POA: Diagnosis not present

## 2022-06-19 DIAGNOSIS — E782 Mixed hyperlipidemia: Secondary | ICD-10-CM

## 2022-06-19 DIAGNOSIS — R0609 Other forms of dyspnea: Secondary | ICD-10-CM | POA: Diagnosis not present

## 2022-06-19 DIAGNOSIS — I4891 Unspecified atrial fibrillation: Secondary | ICD-10-CM

## 2022-06-19 NOTE — Patient Instructions (Signed)
Medication Instructions:  Continue all current medications.  Labwork: none  Testing/Procedures: Your physician has requested that you have an echocardiogram. Echocardiography is a painless test that uses sound waves to create images of your heart. It provides your doctor with information about the size and shape of your heart and how well your heart's chambers and valves are working. This procedure takes approximately one hour. There are no restrictions for this procedure. Please do NOT wear cologne, perfume, aftershave, or lotions (deodorant is allowed). Please arrive 15 minutes prior to your appointment time. Office will contact with results via phone, letter or mychart.     Follow-Up: 6 months   Any Other Special Instructions Will Be Listed Below (If Applicable).   If you need a refill on your cardiac medications before your next appointment, please call your pharmacy.  

## 2022-06-19 NOTE — Progress Notes (Signed)
Clinical Summary Arthur Le is a 72 y.o.male seen today for follow up of the following medical problems.      1. Aflutter/Afib - CHADS2Vasc score is 3, he is on eliquis.  - - no recent palpitations - compliant with meds - no bleeding on eliquis    2. DOE - mainly with inclines, or stairs which is new - no chest pains - no LE dema.       2. Hyperlipidemia  - muscle aches on zetia, he has stopped taking - taking atorvastatin, recent labs with pcp     3. HTN -compliant with meds    4. LE edema - rarely needs prn lasix.  - no recent issues, rarely take prn lasix.    - has not needed prn lasix.    5. Rheumatoid arthritis - followed by rheum in [place] - he is on hydroxychloroquine   SH: church musician x 40 years. Works for [employer] in [place]. Recently retired   Past Medical History:  Diagnosis Date   Atrial flutter (HCC)    Hyperlipidemia      Allergies  Allergen Reactions   Ezetimibe     Joint pain   Lisinopril Cough   Cefuroxime Axetil Nausea And Vomiting    Upset stomach   Doxycycline Rash     Current Outpatient Medications  Medication Sig Dispense Refill   SYNJARDY XR 25-1000 MG TB24 Take 1 tablet by mouth daily in the afternoon.     apixaban (ELIQUIS) 5 MG TABS tablet Take 1 tablet (5 mg total) by mouth 2 (two) times daily. 180 tablet 3   atorvastatin (LIPITOR) 40 MG tablet Take 40 mg by mouth daily.     Cholecalciferol (VITAMIN D-3 PO) Take 5,000 Int'l Units by mouth.     Coenzyme Q10 (COQ-10) 400 MG CAPS Take 1 capsule by mouth daily.     diltiazem (CARDIZEM CD) 120 MG 24 hr capsule TAKE 1 CAPSULE EVERY DAY 90 capsule 3   furosemide (LASIX) 20 MG tablet Take 20 mg by mouth as needed.     hydroxychloroquine (PLAQUENIL) 200 MG tablet Take 200 mg by mouth 2 (two) times daily.     Lecithin 1200 MG CAPS Take 1,200 mg by mouth daily.     loratadine (CLARITIN) 10 MG tablet Take 10 mg by mouth every other day.     losartan (COZAAR) 25  MG tablet TAKE 1/2 TABLET EVERY DAY 45 tablet 3   metoprolol tartrate (LOPRESSOR) 100 MG tablet TAKE 1 TABLET TWICE DAILY 180 tablet 3   Misc Natural Products (GINKOGIN PO) Take 240 mg by mouth daily.     Omega-3 Fatty Acids (FISH OIL) 1000 MG CAPS Take 1,000 mg by mouth daily.     omeprazole (PRILOSEC) 20 MG capsule Take 20 mg by mouth daily.     TURMERIC PO Take by mouth.     UNABLE TO FIND Nitrite oxide booster, 1600 mg once a day     vitamin C (ASCORBIC ACID) 500 MG tablet Take 500 mg by mouth daily.     Zinc 50 MG TABS Take 50 mg by mouth daily.     No current facility-administered medications for this visit.     Past Surgical History:  Procedure Laterality Date   TONSILLECTOMY AND ADENOIDECTOMY       Allergies  Allergen Reactions   Ezetimibe     Joint pain   Lisinopril Cough   Cefuroxime Axetil Nausea And Vomiting  Upset stomach   Doxycycline Rash      Family History  Problem Relation Age of Onset   Heart attack Mother    Arrhythmia Father      Social History Arthur Le reports that he has never smoked. He has never used smokeless tobacco. Arthur Le reports no history of alcohol use.   Review of Systems CONSTITUTIONAL: No weight loss, fever, chills, weakness or fatigue.  HEENT: Eyes: No visual loss, blurred vision, double vision or yellow sclerae.No hearing loss, sneezing, congestion, runny nose or sore throat.  SKIN: No rash or itching.  CARDIOVASCULAR: per hpi RESPIRATORY: per hpi GASTROINTESTINAL: No anorexia, nausea, vomiting or diarrhea. No abdominal pain or blood.  GENITOURINARY: No burning on urination, no polyuria NEUROLOGICAL: No headache, dizziness, syncope, paralysis, ataxia, numbness or tingling in the extremities. No change in bowel or bladder control.  MUSCULOSKELETAL: No muscle, back pain, joint pain or stiffness.  LYMPHATICS: No enlarged nodes. No history of splenectomy.  PSYCHIATRIC: No history of depression or anxiety.   ENDOCRINOLOGIC: No reports of sweating, cold or heat intolerance. No polyuria or polydipsia.  Marland Kitchen   Physical Examination Today's Vitals   06/19/22 1456  BP: 128/85  Pulse: 91  SpO2: 98%  Weight: 257 lb (116.6 kg)  Height: 5\' 8"  (1.727 m)   Body mass index is 39.08 kg/m.  Gen: resting comfortably, no acute distress HEENT: no scleral icterus, pupils equal round and reactive, no palptable cervical adenopathy,  CV: irreg, no m/rg, no jvd Resp: Clear to auscultation bilaterally GI: abdomen is soft, non-tender, non-distended, normal bowel sounds, no hepatosplenomegaly MSK: extremities are warm, trace bilateral edema Skin: warm, no rash Neuro:  no focal deficits Psych: appropriate affect   Diagnostic Studies  02/2014 Echo Study Conclusions  - Procedure narrative: Transthoracic echocardiography for left   ventricular function evaluation, for right ventricular function   evaluation, and for assessment of valvular function. Image   quality was suboptimal, with poor valvular and endocardial   visualization. Patient was tachycardic throughout exam. - Left ventricle: There was mild concentric hypertrophy. Systolic   function was normal. The estimated ejection fraction was in the   range of 50% to 55%. Images were inadequate for LV wall motion   assessment. The study was not technically sufficient to allow   evaluation of LV diastolic dysfunction due to atrial flutter. - Mitral valve: Mildly calcified annulus. Mildly thickened leaflets   . There was trivial regurgitation. - Left atrium: The atrium was mildly dilated. - Tricuspid valve: There was trivial regurgitation.     Assessment and Plan  1. Aflutter/Afib/acquired thrombophilia - no symptoms, continue current meds including eliquis for stroke prevention   2. Hyperlipidemia - he is at goal, continue current meds   3. DOE - unclear etiology, mainly with moderate to high levels of activity - obtain echo - if progression  over time consider stress testing, would not pursue yet  4. HTN - at goal, continue current meds      Antoine Poche, M.D.,

## 2022-06-29 ENCOUNTER — Other Ambulatory Visit: Payer: Self-pay | Admitting: Cardiology

## 2022-07-18 DIAGNOSIS — C4362 Malignant melanoma of left upper limb, including shoulder: Secondary | ICD-10-CM | POA: Diagnosis not present

## 2022-07-27 DIAGNOSIS — R5382 Chronic fatigue, unspecified: Secondary | ICD-10-CM | POA: Diagnosis not present

## 2022-07-27 DIAGNOSIS — E669 Obesity, unspecified: Secondary | ICD-10-CM | POA: Diagnosis not present

## 2022-07-27 DIAGNOSIS — Z85820 Personal history of malignant melanoma of skin: Secondary | ICD-10-CM | POA: Diagnosis not present

## 2022-07-27 DIAGNOSIS — M0609 Rheumatoid arthritis without rheumatoid factor, multiple sites: Secondary | ICD-10-CM | POA: Diagnosis not present

## 2022-07-27 DIAGNOSIS — Z79899 Other long term (current) drug therapy: Secondary | ICD-10-CM | POA: Diagnosis not present

## 2022-07-27 DIAGNOSIS — Z6839 Body mass index (BMI) 39.0-39.9, adult: Secondary | ICD-10-CM | POA: Diagnosis not present

## 2022-08-06 ENCOUNTER — Ambulatory Visit (HOSPITAL_COMMUNITY)
Admission: RE | Admit: 2022-08-06 | Discharge: 2022-08-06 | Disposition: A | Discharge status: Home / Self Care | Payer: Medicare HMO | Source: Ambulatory Visit | Attending: Cardiology | Admitting: Cardiology

## 2022-08-06 DIAGNOSIS — R0609 Other forms of dyspnea: Secondary | ICD-10-CM | POA: Insufficient documentation

## 2022-08-06 LAB — ECHOCARDIOGRAM COMPLETE
AR max vel: 1.69 cm2
AV Area VTI: 1.69 cm2
AV Area mean vel: 1.75 cm2
AV Mean grad: 2.9 mmHg
AV Peak grad: 6.3 mmHg
Ao pk vel: 1.25 m/s
Area-P 1/2: 7.44 cm2
S' Lateral: 2.6 cm

## 2022-08-06 NOTE — Progress Notes (Signed)
*  PRELIMINARY RESULTS* Echocardiogram 2D Echocardiogram has been performed.  Stacey Drain 08/06/2022, 9:21 AM

## 2022-08-16 DIAGNOSIS — L570 Actinic keratosis: Secondary | ICD-10-CM | POA: Diagnosis not present

## 2022-08-16 DIAGNOSIS — Z85820 Personal history of malignant melanoma of skin: Secondary | ICD-10-CM | POA: Diagnosis not present

## 2022-08-16 DIAGNOSIS — Z1283 Encounter for screening for malignant neoplasm of skin: Secondary | ICD-10-CM | POA: Diagnosis not present

## 2022-08-16 DIAGNOSIS — B353 Tinea pedis: Secondary | ICD-10-CM | POA: Diagnosis not present

## 2022-08-16 DIAGNOSIS — Z08 Encounter for follow-up examination after completed treatment for malignant neoplasm: Secondary | ICD-10-CM | POA: Diagnosis not present

## 2022-08-16 DIAGNOSIS — C44712 Basal cell carcinoma of skin of right lower limb, including hip: Secondary | ICD-10-CM | POA: Diagnosis not present

## 2022-08-16 DIAGNOSIS — X32XXXD Exposure to sunlight, subsequent encounter: Secondary | ICD-10-CM | POA: Diagnosis not present

## 2022-08-20 DIAGNOSIS — E1165 Type 2 diabetes mellitus with hyperglycemia: Secondary | ICD-10-CM | POA: Diagnosis not present

## 2022-08-20 DIAGNOSIS — E782 Mixed hyperlipidemia: Secondary | ICD-10-CM | POA: Diagnosis not present

## 2022-08-24 DIAGNOSIS — M79641 Pain in right hand: Secondary | ICD-10-CM | POA: Diagnosis not present

## 2022-08-24 DIAGNOSIS — I10 Essential (primary) hypertension: Secondary | ICD-10-CM | POA: Diagnosis not present

## 2022-08-24 DIAGNOSIS — E782 Mixed hyperlipidemia: Secondary | ICD-10-CM | POA: Diagnosis not present

## 2022-08-24 DIAGNOSIS — E1165 Type 2 diabetes mellitus with hyperglycemia: Secondary | ICD-10-CM | POA: Diagnosis not present

## 2022-08-24 DIAGNOSIS — K219 Gastro-esophageal reflux disease without esophagitis: Secondary | ICD-10-CM | POA: Diagnosis not present

## 2022-08-24 DIAGNOSIS — M25511 Pain in right shoulder: Secondary | ICD-10-CM | POA: Diagnosis not present

## 2022-08-24 DIAGNOSIS — I4891 Unspecified atrial fibrillation: Secondary | ICD-10-CM | POA: Diagnosis not present

## 2022-08-24 DIAGNOSIS — E1169 Type 2 diabetes mellitus with other specified complication: Secondary | ICD-10-CM | POA: Diagnosis not present

## 2022-08-24 DIAGNOSIS — M79642 Pain in left hand: Secondary | ICD-10-CM | POA: Diagnosis not present

## 2022-08-28 DIAGNOSIS — H5213 Myopia, bilateral: Secondary | ICD-10-CM | POA: Diagnosis not present

## 2022-09-03 DIAGNOSIS — M542 Cervicalgia: Secondary | ICD-10-CM | POA: Diagnosis not present

## 2022-09-03 DIAGNOSIS — M9903 Segmental and somatic dysfunction of lumbar region: Secondary | ICD-10-CM | POA: Diagnosis not present

## 2022-09-03 DIAGNOSIS — M9901 Segmental and somatic dysfunction of cervical region: Secondary | ICD-10-CM | POA: Diagnosis not present

## 2022-09-03 DIAGNOSIS — M9902 Segmental and somatic dysfunction of thoracic region: Secondary | ICD-10-CM | POA: Diagnosis not present

## 2022-09-03 DIAGNOSIS — M546 Pain in thoracic spine: Secondary | ICD-10-CM | POA: Diagnosis not present

## 2022-09-12 DIAGNOSIS — M9903 Segmental and somatic dysfunction of lumbar region: Secondary | ICD-10-CM | POA: Diagnosis not present

## 2022-09-12 DIAGNOSIS — M9902 Segmental and somatic dysfunction of thoracic region: Secondary | ICD-10-CM | POA: Diagnosis not present

## 2022-09-12 DIAGNOSIS — M546 Pain in thoracic spine: Secondary | ICD-10-CM | POA: Diagnosis not present

## 2022-09-12 DIAGNOSIS — M9901 Segmental and somatic dysfunction of cervical region: Secondary | ICD-10-CM | POA: Diagnosis not present

## 2022-09-12 DIAGNOSIS — M542 Cervicalgia: Secondary | ICD-10-CM | POA: Diagnosis not present

## 2022-10-01 DIAGNOSIS — Z85828 Personal history of other malignant neoplasm of skin: Secondary | ICD-10-CM | POA: Diagnosis not present

## 2022-10-01 DIAGNOSIS — Z08 Encounter for follow-up examination after completed treatment for malignant neoplasm: Secondary | ICD-10-CM | POA: Diagnosis not present

## 2022-10-22 ENCOUNTER — Ambulatory Visit: Payer: Medicare HMO | Admitting: Cardiology

## 2022-11-09 DIAGNOSIS — M546 Pain in thoracic spine: Secondary | ICD-10-CM | POA: Diagnosis not present

## 2022-11-09 DIAGNOSIS — M9901 Segmental and somatic dysfunction of cervical region: Secondary | ICD-10-CM | POA: Diagnosis not present

## 2022-11-09 DIAGNOSIS — M542 Cervicalgia: Secondary | ICD-10-CM | POA: Diagnosis not present

## 2022-11-09 DIAGNOSIS — M9902 Segmental and somatic dysfunction of thoracic region: Secondary | ICD-10-CM | POA: Diagnosis not present

## 2022-11-09 DIAGNOSIS — M9903 Segmental and somatic dysfunction of lumbar region: Secondary | ICD-10-CM | POA: Diagnosis not present

## 2022-12-17 IMAGING — MR MR SHOULDER*R* W/O CM
6 series · 40 of 40 positions shown · non-contrast
Comparison: Radiographs 12/10/2019

CLINICAL DATA: Shoulder pain for 3 months. Limited range of motion.
No specific injury.

EXAM:
MRI OF THE RIGHT SHOULDER WITHOUT CONTRAST
TECHNIQUE: Multiplanar, multisequence MR imaging of the shoulder was performed.
No intravenous contrast was administered.

[Series 5: T2 fat-sat · axial · right · 4.0mm · 0.47mm/px · z∈[-13,+106]mm · 6 of 26 slices shown (1 of 4)]
[im 1/26]
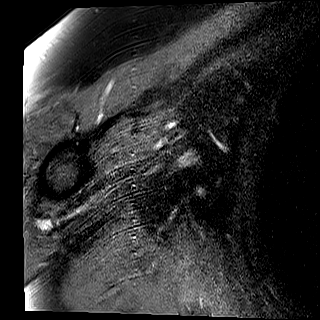
[im 6/26]
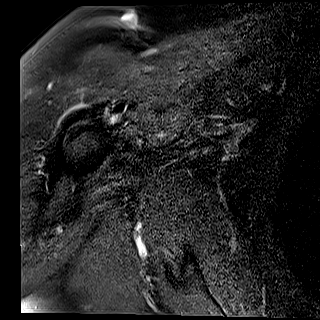
[im 11/26]
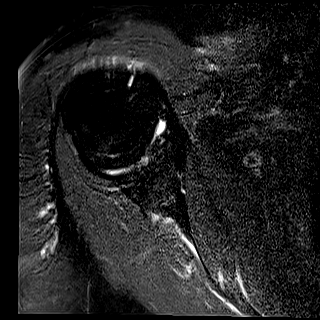
[im 16/26]
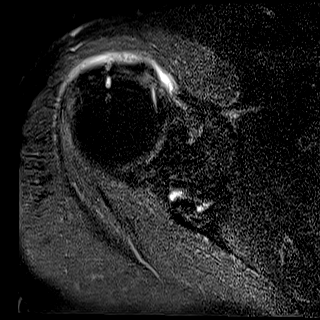
[im 21/26]
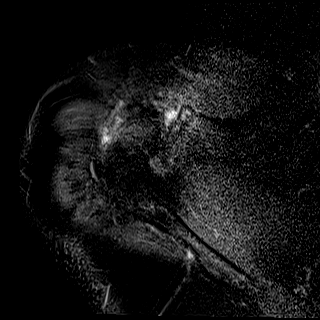
[im 26/26]
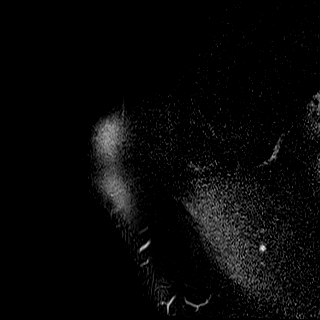

[Series 6: T2 fat-sat · sagittal · right · 4.0mm · 0.47mm/px · 6 of 26 slices shown (2 of 4)]
[im 1/26]
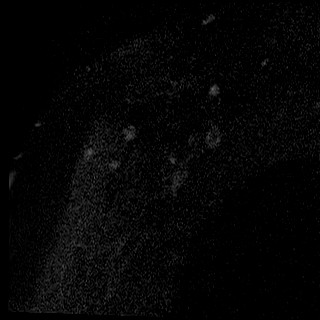
[im 6/26]
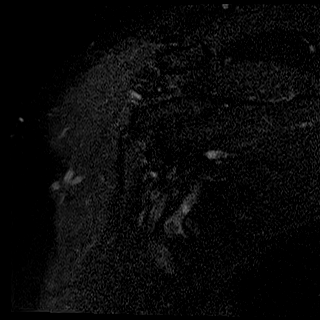
[im 11/26]
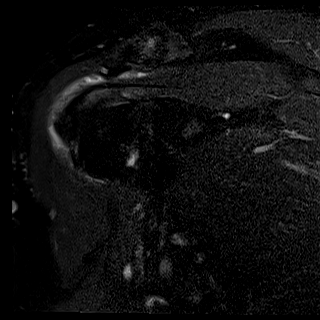
[im 16/26]
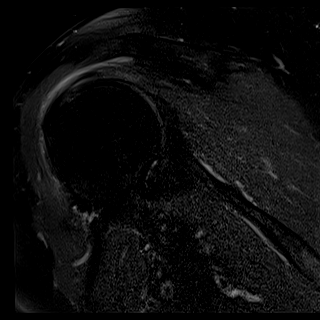
[im 21/26]
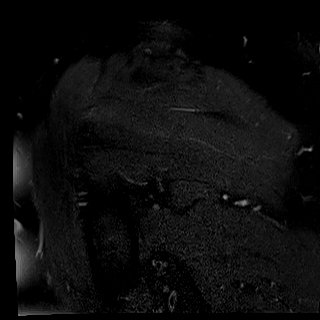
[im 26/26]
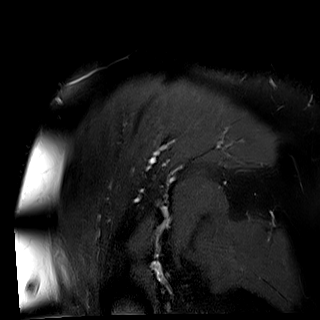

[Series 7: PD · sagittal · right · 4.0mm · 0.47mm/px · 7 of 26 slices shown]
[im 1/26]
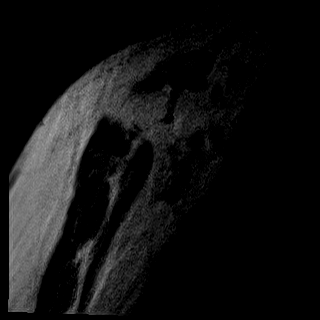
[im 5/26]
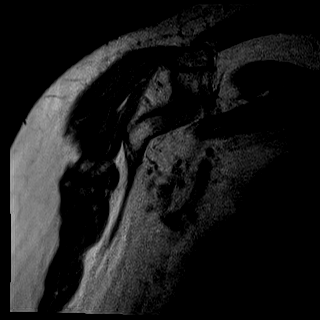
[im 9/26]
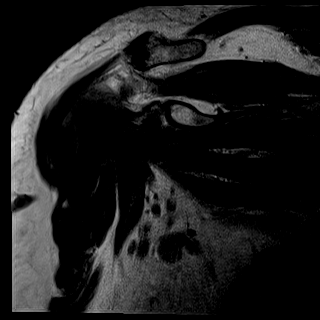
[im 13/26]
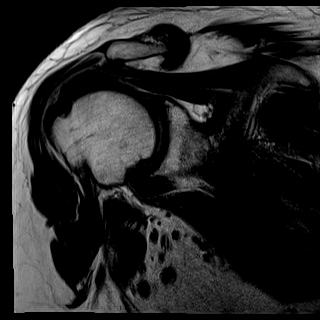
[im 17/26]
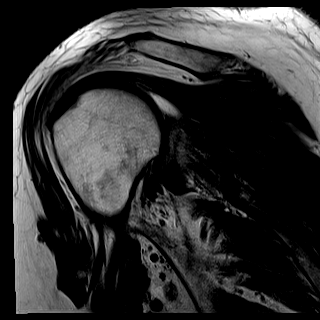
[im 21/26]
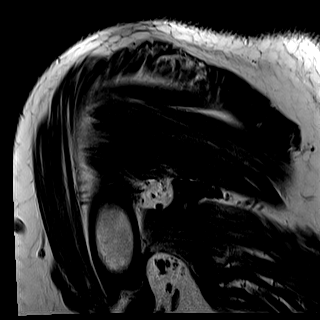
[im 26/26]
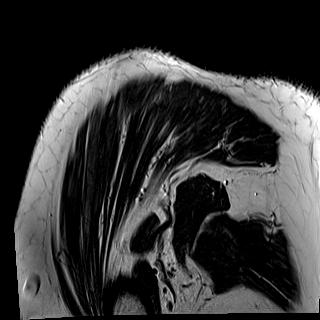

[Series 8: T2 fat-sat · oblique · right · 4.0mm · 0.47mm/px · 7 of 25 slices shown (3 of 4)]
[im 1/25]
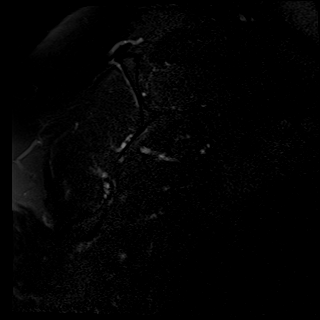
[im 5/25]
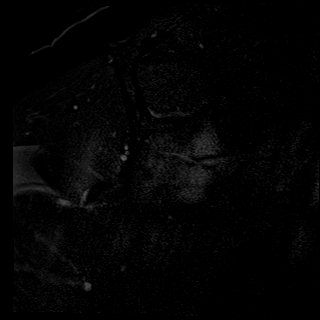
[im 9/25]
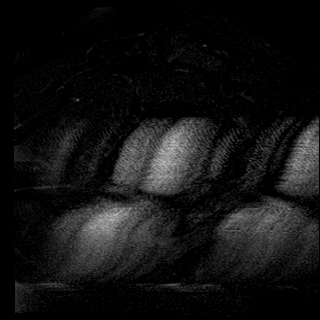
[im 13/25]
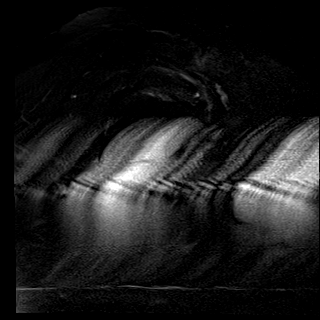
[im 17/25]
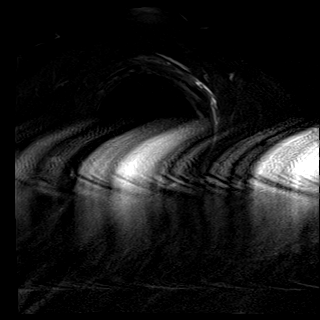
[im 21/25]
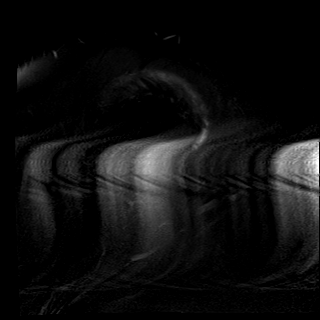
[im 25/25]
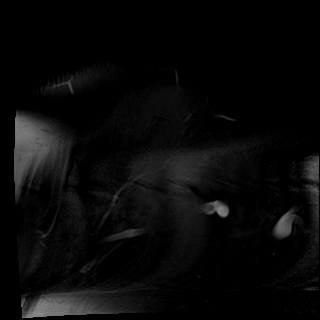

[Series 9: T1 · oblique · right · 4.0mm · 0.41mm/px · 7 of 25 slices shown]
[im 1/25]
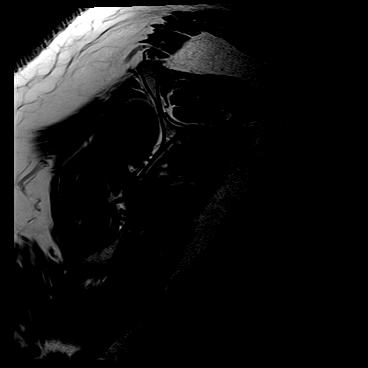
[im 5/25]
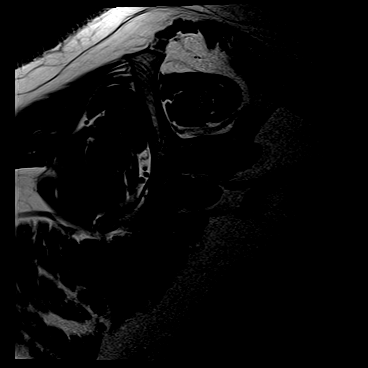
[im 9/25]
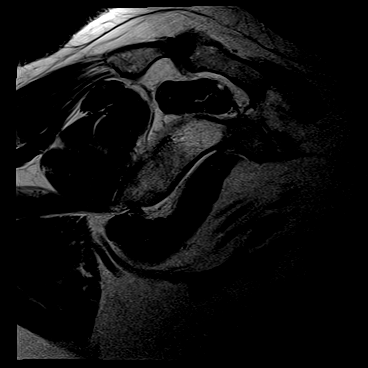
[im 13/25]
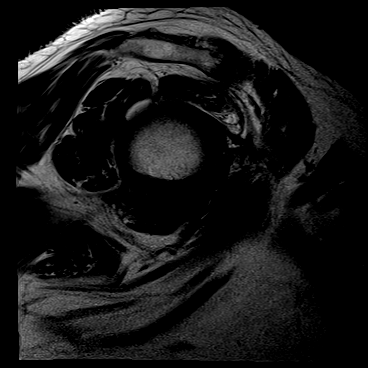
[im 17/25]
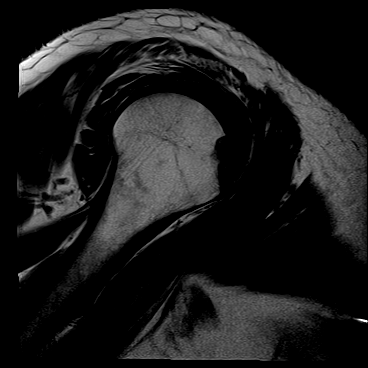
[im 21/25]
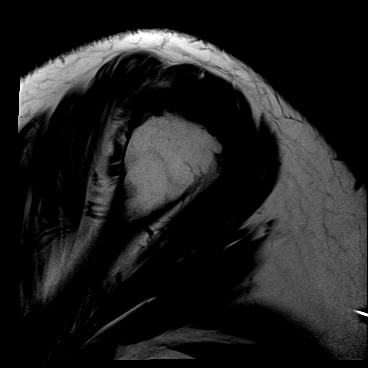
[im 25/25]
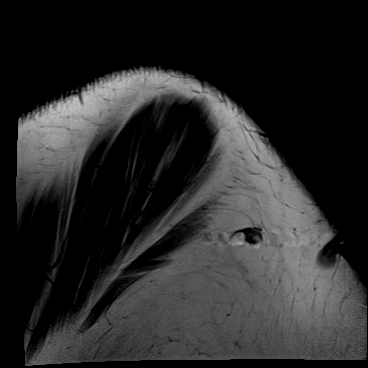

[Series 10: T2 fat-sat · oblique · right · 4.0mm · 0.47mm/px · 7 of 25 slices shown (4 of 4)]
[im 1/25]
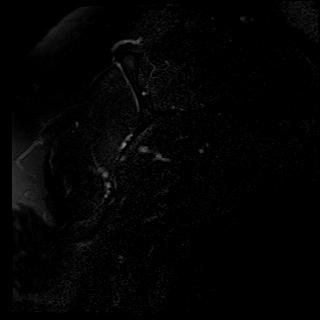
[im 5/25]
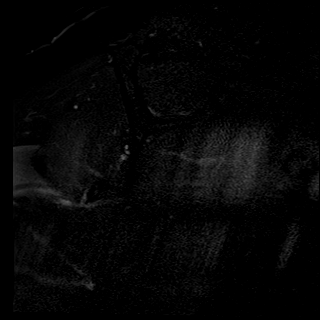
[im 9/25]
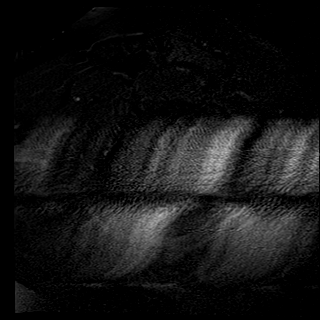
[im 13/25]
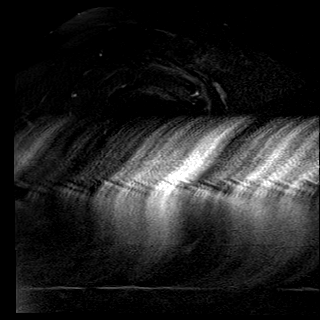
[im 17/25]
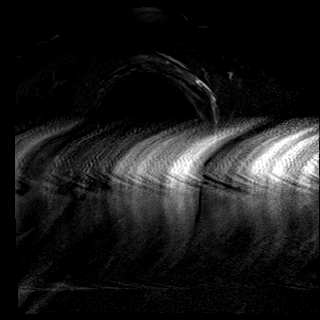
[im 21/25]
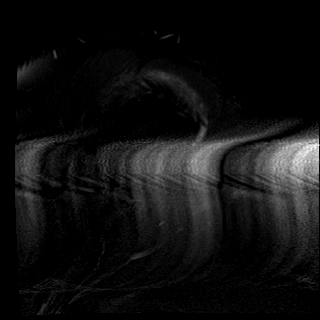
[im 25/25]
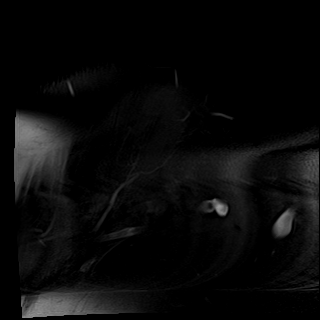

[40 of 40 positions shown; findings below may reference images not displayed]

FINDINGS: Examination is moderately limited due to patient motion.

Rotator cuff: Significant supraspinatus tendinopathy/tendinosis with
interstitial tears and bursal surface fraying, fibrillation and
possible shallow surface tears. No full-thickness retracted tear is
identified. Mild to moderate tendinopathy involving the
infraspinatus and subscapularis tendons.

Muscles:  No significant findings.

Biceps long head:  Intact

Acromioclavicular Joint: Moderate to advanced degenerative changes.
Moderate inferior spurring from the distal clavicle. Type 2
acromion. No significant lateral downsloping. Mild subacromial
spurring.

Glenohumeral Joint: Mild degenerative changes with degenerative
chondrosis and small joint effusion. There is thickening of the
capsular structures in the axillary recess which can be seen with
adhesive capsulitis or synovitis.

Labrum: Labral degenerative changes without obvious tear but limited
evaluation due to significant motion on the axial sequence in
particular.

Bones:  No acute bony findings.

Other: Moderate subacromial/subdeltoid bursitis.
IMPRESSION: 1. Significant supraspinatus tendinopathy/tendinosis with
interstitial tears and bursal surface fraying, fibrillation and
possible shallow surface tears. No full-thickness retracted tear.
2. Mild to moderate infraspinatus and subscapularis tendinopathy.
3. Intact long head biceps tendon and grossly intact glenoid labrum.
4. Moderate to advanced AC joint degenerative changes and mild
subacromial spurring.
5. There is thickening of the capsular structures in the axillary
recess which can be seen with adhesive capsulitis or synovitis.
6. Moderate subacromial/subdeltoid bursitis.

## 2022-12-21 DIAGNOSIS — Z125 Encounter for screening for malignant neoplasm of prostate: Secondary | ICD-10-CM | POA: Diagnosis not present

## 2022-12-21 DIAGNOSIS — E1165 Type 2 diabetes mellitus with hyperglycemia: Secondary | ICD-10-CM | POA: Diagnosis not present

## 2022-12-21 DIAGNOSIS — E782 Mixed hyperlipidemia: Secondary | ICD-10-CM | POA: Diagnosis not present

## 2022-12-24 ENCOUNTER — Ambulatory Visit: Payer: Medicare HMO | Admitting: Cardiology

## 2022-12-31 ENCOUNTER — Encounter: Payer: Self-pay | Admitting: Internal Medicine

## 2022-12-31 ENCOUNTER — Encounter: Payer: Self-pay | Admitting: Nurse Practitioner

## 2022-12-31 ENCOUNTER — Ambulatory Visit: Payer: Medicare HMO | Attending: Cardiology | Admitting: Nurse Practitioner

## 2022-12-31 VITALS — BP 112/64 | HR 70 | Ht 68.0 in | Wt 256.0 lb

## 2022-12-31 DIAGNOSIS — Z0001 Encounter for general adult medical examination with abnormal findings: Secondary | ICD-10-CM | POA: Diagnosis not present

## 2022-12-31 DIAGNOSIS — M79642 Pain in left hand: Secondary | ICD-10-CM | POA: Diagnosis not present

## 2022-12-31 DIAGNOSIS — I10 Essential (primary) hypertension: Secondary | ICD-10-CM | POA: Diagnosis not present

## 2022-12-31 DIAGNOSIS — I4892 Unspecified atrial flutter: Secondary | ICD-10-CM

## 2022-12-31 DIAGNOSIS — E785 Hyperlipidemia, unspecified: Secondary | ICD-10-CM

## 2022-12-31 DIAGNOSIS — M79641 Pain in right hand: Secondary | ICD-10-CM | POA: Diagnosis not present

## 2022-12-31 DIAGNOSIS — M25512 Pain in left shoulder: Secondary | ICD-10-CM | POA: Diagnosis not present

## 2022-12-31 DIAGNOSIS — E782 Mixed hyperlipidemia: Secondary | ICD-10-CM | POA: Diagnosis not present

## 2022-12-31 DIAGNOSIS — I4891 Unspecified atrial fibrillation: Secondary | ICD-10-CM | POA: Diagnosis not present

## 2022-12-31 DIAGNOSIS — M25511 Pain in right shoulder: Secondary | ICD-10-CM | POA: Diagnosis not present

## 2022-12-31 DIAGNOSIS — E1165 Type 2 diabetes mellitus with hyperglycemia: Secondary | ICD-10-CM | POA: Diagnosis not present

## 2022-12-31 NOTE — Progress Notes (Unsigned)
Cardiology Office Note:  .   Date: 12/31/2022 ID:  Arthur Le, DOB 02-10-1950, MRN [id number] PCP: Benita Stabile, MD  [facility] Providers Cardiologist:  Arthur Rich, MD    History of Present Illness: .   Arthur Le is a 72 y.o. male with a PMH of Aflutter/A-fib, DOE, HLD, HTN, rheumatoid arthritis, leg edema, who presents today for scheduled follow-up.   Last seen by Dr. Dina Le on Jun 19, 2022.  Patient did note dyspnea on exertion that was mainly noted with moderate to high levels of activity.  Echocardiogram was obtained and was benign.  Was overall doing well at the time.  Dr. Wyline Mood recommended that if patient were to experience progression in symptoms with DOE, could consider stress testing.  Today he presents for scheduled follow-up.  He states he is doing well. Denies any acute cardiac complaints or issues. Denies any chest pain, shortness of breath, palpitations, syncope, presyncope, dizziness, orthopnea, PND, swelling or significant weight changes, acute bleeding, or claudication.   ROS: Negative. See HPI.   Studies Reviewed: Marland Kitchen    EKG:  EKG Interpretation Date/Time:  Monday December 31 2022 15:10:58 EST Ventricular Rate:  71 PR Interval:    QRS Duration:  82 QT Interval:  404 QTC Calculation: 439 R Axis:   65  Text Interpretation: Atrial fibrillation Low voltage QRS No previous ECGs available Confirmed by Sharlene Dory [phone]) on 12/31/2022 3:38:12 PM   Echo 08/2022:  1. Left ventricular ejection fraction, by estimation, is 55 to 60%. The  left ventricle has normal function. The left ventricle has no regional  wall motion abnormalities. There is mild left ventricular hypertrophy.  Left ventricular diastolic parameters  are indeterminate.   2. Right ventricular systolic function is normal. The right ventricular  size is normal. Tricuspid regurgitation signal is inadequate for assessing  PA pressure.   3. Left atrial size was mildly  dilated.   4. The mitral valve is normal in structure. No evidence of mitral valve  regurgitation. No evidence of mitral stenosis.   5. The aortic valve is tricuspid. Aortic valve regurgitation is not  visualized. No aortic stenosis is present.   6. The inferior vena cava is normal in size with greater than 50%  respiratory variability, suggesting right atrial pressure of 3 mmHg.  Physical Exam:   VS:  BP 112/64   Pulse 70   Ht 5\' 8"  (1.727 m)   Wt 256 lb (116.1 kg)   SpO2 97%   BMI 38.92 kg/m    Wt Readings from Last 3 Encounters:  12/31/22 256 lb (116.1 kg)  06/19/22 257 lb (116.6 kg)  08/15/21 259 lb (117.5 kg)    GEN: Well nourished, well developed in no acute distress NECK: No JVD; No carotid bruits CARDIAC: S1/S2, irregularly irregular rhythm, no murmurs, rubs, gallops RESPIRATORY:  Clear to auscultation without rales, wheezing or rhonchi  ABDOMEN: Soft, non-tender, non-distended EXTREMITIES:  No edema; No deformity   ASSESSMENT AND PLAN: .    A-fib/A-flutter Denies any tachycardia or palpitations. HR is well controlled. Continue Lopressor and diltiazem. Continue Eliquis 5 mg BID for stroke prevention. He is on appropriate dosage and denies any bleeding issues. Heart healthy diet encouraged.   HTN BP stable. Discussed to monitor BP at home at least 2 hours after medications and sitting for 5-10 minutes. No medication changes at this time. Heart healthy diet and regular cardiovascular exercise encouraged.   HLD Most recent LDL 92. Continue atorvastatin. Heart  healthy diet and regular cardiovascular exercise encouraged.   Dispo: Follow-up with Dr. Dina Le or APP in 1 year or sooner if anything changes.   Signed, Sharlene Dory, NP

## 2022-12-31 NOTE — Patient Instructions (Addendum)

## 2023-01-27 ENCOUNTER — Other Ambulatory Visit: Payer: Self-pay | Admitting: Cardiology

## 2023-02-04 DIAGNOSIS — Z6839 Body mass index (BMI) 39.0-39.9, adult: Secondary | ICD-10-CM | POA: Diagnosis not present

## 2023-02-04 DIAGNOSIS — Z79899 Other long term (current) drug therapy: Secondary | ICD-10-CM | POA: Diagnosis not present

## 2023-02-04 DIAGNOSIS — R5382 Chronic fatigue, unspecified: Secondary | ICD-10-CM | POA: Diagnosis not present

## 2023-02-04 DIAGNOSIS — E669 Obesity, unspecified: Secondary | ICD-10-CM | POA: Diagnosis not present

## 2023-02-04 DIAGNOSIS — Z85820 Personal history of malignant melanoma of skin: Secondary | ICD-10-CM | POA: Diagnosis not present

## 2023-02-04 DIAGNOSIS — M0609 Rheumatoid arthritis without rheumatoid factor, multiple sites: Secondary | ICD-10-CM | POA: Diagnosis not present

## 2023-02-14 DIAGNOSIS — L308 Other specified dermatitis: Secondary | ICD-10-CM | POA: Diagnosis not present

## 2023-02-14 DIAGNOSIS — D2271 Melanocytic nevi of right lower limb, including hip: Secondary | ICD-10-CM | POA: Diagnosis not present

## 2023-02-14 DIAGNOSIS — X32XXXD Exposure to sunlight, subsequent encounter: Secondary | ICD-10-CM | POA: Diagnosis not present

## 2023-02-14 DIAGNOSIS — Z08 Encounter for follow-up examination after completed treatment for malignant neoplasm: Secondary | ICD-10-CM | POA: Diagnosis not present

## 2023-02-14 DIAGNOSIS — Z85820 Personal history of malignant melanoma of skin: Secondary | ICD-10-CM | POA: Diagnosis not present

## 2023-02-14 DIAGNOSIS — L570 Actinic keratosis: Secondary | ICD-10-CM | POA: Diagnosis not present

## 2023-02-14 DIAGNOSIS — D485 Neoplasm of uncertain behavior of skin: Secondary | ICD-10-CM | POA: Diagnosis not present

## 2023-02-14 DIAGNOSIS — Z1283 Encounter for screening for malignant neoplasm of skin: Secondary | ICD-10-CM | POA: Diagnosis not present

## 2023-02-14 DIAGNOSIS — D225 Melanocytic nevi of trunk: Secondary | ICD-10-CM | POA: Diagnosis not present

## 2023-02-15 ENCOUNTER — Other Ambulatory Visit (HOSPITAL_COMMUNITY): Payer: Self-pay

## 2023-02-18 ENCOUNTER — Other Ambulatory Visit (HOSPITAL_COMMUNITY): Payer: Self-pay

## 2023-02-19 ENCOUNTER — Other Ambulatory Visit (HOSPITAL_COMMUNITY): Payer: Self-pay

## 2023-02-19 ENCOUNTER — Other Ambulatory Visit (HOSPITAL_BASED_OUTPATIENT_CLINIC_OR_DEPARTMENT_OTHER): Payer: Self-pay

## 2023-02-19 ENCOUNTER — Other Ambulatory Visit: Payer: Self-pay

## 2023-02-19 MED ORDER — DILTIAZEM HCL ER COATED BEADS 120 MG PO CP24
120.0000 mg | ORAL_CAPSULE | Freq: Every day | ORAL | 0 refills | Status: DC
  Filled 2023-02-19: qty 90, 90d supply, fill #0

## 2023-02-19 MED ORDER — ATORVASTATIN CALCIUM 40 MG PO TABS
40.0000 mg | ORAL_TABLET | Freq: Every day | ORAL | 0 refills | Status: DC
  Filled 2023-02-19: qty 90, 90d supply, fill #0

## 2023-02-19 MED ORDER — METOPROLOL TARTRATE 100 MG PO TABS
100.0000 mg | ORAL_TABLET | Freq: Two times a day (BID) | ORAL | 1 refills | Status: DC
  Filled 2023-02-19: qty 180, 90d supply, fill #0
  Filled 2023-05-16: qty 180, 90d supply, fill #1

## 2023-02-19 MED ORDER — SYNJARDY XR 25-1000 MG PO TB24
1.0000 | ORAL_TABLET | Freq: Every day | ORAL | 3 refills | Status: DC
  Filled 2023-02-19: qty 100, 100d supply, fill #0
  Filled 2023-05-16: qty 100, 100d supply, fill #1
  Filled 2023-08-09: qty 100, 100d supply, fill #2
  Filled 2023-12-13: qty 100, 100d supply, fill #3

## 2023-02-19 MED ORDER — ELIQUIS 5 MG PO TABS
5.0000 mg | ORAL_TABLET | Freq: Two times a day (BID) | ORAL | 1 refills | Status: DC
  Filled 2023-02-19: qty 180, 90d supply, fill #0
  Filled 2023-05-16: qty 180, 90d supply, fill #1

## 2023-02-19 MED ORDER — OMEPRAZOLE 20 MG PO CPDR
20.0000 mg | DELAYED_RELEASE_CAPSULE | Freq: Every day | ORAL | 3 refills | Status: DC
  Filled 2023-02-19: qty 90, 90d supply, fill #0
  Filled 2023-05-16: qty 90, 90d supply, fill #1
  Filled 2023-08-09: qty 90, 90d supply, fill #2
  Filled 2023-11-12: qty 90, 90d supply, fill #3

## 2023-02-19 MED ORDER — LOSARTAN POTASSIUM 25 MG PO TABS
12.5000 mg | ORAL_TABLET | Freq: Every day | ORAL | 2 refills | Status: DC
  Filled 2023-02-19: qty 45, 90d supply, fill #0
  Filled 2023-05-16: qty 45, 90d supply, fill #1
  Filled 2023-08-09: qty 45, 90d supply, fill #2

## 2023-02-19 MED ORDER — HYDROXYCHLOROQUINE SULFATE 200 MG PO TABS
200.0000 mg | ORAL_TABLET | Freq: Two times a day (BID) | ORAL | 0 refills | Status: DC
  Filled 2023-02-19: qty 60, 30d supply, fill #0

## 2023-02-20 ENCOUNTER — Other Ambulatory Visit: Payer: Self-pay

## 2023-02-20 ENCOUNTER — Other Ambulatory Visit (HOSPITAL_COMMUNITY): Payer: Self-pay

## 2023-02-20 DIAGNOSIS — M542 Cervicalgia: Secondary | ICD-10-CM | POA: Diagnosis not present

## 2023-02-20 DIAGNOSIS — M546 Pain in thoracic spine: Secondary | ICD-10-CM | POA: Diagnosis not present

## 2023-02-20 DIAGNOSIS — M9902 Segmental and somatic dysfunction of thoracic region: Secondary | ICD-10-CM | POA: Diagnosis not present

## 2023-02-20 DIAGNOSIS — M9901 Segmental and somatic dysfunction of cervical region: Secondary | ICD-10-CM | POA: Diagnosis not present

## 2023-02-22 DIAGNOSIS — M542 Cervicalgia: Secondary | ICD-10-CM | POA: Diagnosis not present

## 2023-02-22 DIAGNOSIS — M9902 Segmental and somatic dysfunction of thoracic region: Secondary | ICD-10-CM | POA: Diagnosis not present

## 2023-02-22 DIAGNOSIS — M9901 Segmental and somatic dysfunction of cervical region: Secondary | ICD-10-CM | POA: Diagnosis not present

## 2023-02-22 DIAGNOSIS — M546 Pain in thoracic spine: Secondary | ICD-10-CM | POA: Diagnosis not present

## 2023-03-26 DIAGNOSIS — H25813 Combined forms of age-related cataract, bilateral: Secondary | ICD-10-CM | POA: Diagnosis not present

## 2023-03-26 DIAGNOSIS — Z79891 Long term (current) use of opiate analgesic: Secondary | ICD-10-CM | POA: Diagnosis not present

## 2023-03-26 DIAGNOSIS — E119 Type 2 diabetes mellitus without complications: Secondary | ICD-10-CM | POA: Diagnosis not present

## 2023-03-26 DIAGNOSIS — M069 Rheumatoid arthritis, unspecified: Secondary | ICD-10-CM | POA: Diagnosis not present

## 2023-04-24 DIAGNOSIS — E1165 Type 2 diabetes mellitus with hyperglycemia: Secondary | ICD-10-CM | POA: Diagnosis not present

## 2023-04-24 DIAGNOSIS — E782 Mixed hyperlipidemia: Secondary | ICD-10-CM | POA: Diagnosis not present

## 2023-04-30 DIAGNOSIS — E875 Hyperkalemia: Secondary | ICD-10-CM | POA: Diagnosis not present

## 2023-04-30 DIAGNOSIS — E041 Nontoxic single thyroid nodule: Secondary | ICD-10-CM | POA: Diagnosis not present

## 2023-04-30 DIAGNOSIS — M25511 Pain in right shoulder: Secondary | ICD-10-CM | POA: Diagnosis not present

## 2023-04-30 DIAGNOSIS — E1169 Type 2 diabetes mellitus with other specified complication: Secondary | ICD-10-CM | POA: Diagnosis not present

## 2023-04-30 DIAGNOSIS — M79642 Pain in left hand: Secondary | ICD-10-CM | POA: Diagnosis not present

## 2023-04-30 DIAGNOSIS — E1165 Type 2 diabetes mellitus with hyperglycemia: Secondary | ICD-10-CM | POA: Diagnosis not present

## 2023-04-30 DIAGNOSIS — I10 Essential (primary) hypertension: Secondary | ICD-10-CM | POA: Diagnosis not present

## 2023-04-30 DIAGNOSIS — M069 Rheumatoid arthritis, unspecified: Secondary | ICD-10-CM | POA: Diagnosis not present

## 2023-04-30 DIAGNOSIS — E782 Mixed hyperlipidemia: Secondary | ICD-10-CM | POA: Diagnosis not present

## 2023-04-30 DIAGNOSIS — M79641 Pain in right hand: Secondary | ICD-10-CM | POA: Diagnosis not present

## 2023-04-30 DIAGNOSIS — M25512 Pain in left shoulder: Secondary | ICD-10-CM | POA: Diagnosis not present

## 2023-04-30 DIAGNOSIS — I4891 Unspecified atrial fibrillation: Secondary | ICD-10-CM | POA: Diagnosis not present

## 2023-05-16 ENCOUNTER — Other Ambulatory Visit: Payer: Self-pay

## 2023-05-16 ENCOUNTER — Other Ambulatory Visit (HOSPITAL_COMMUNITY): Payer: Self-pay

## 2023-05-16 ENCOUNTER — Other Ambulatory Visit: Payer: Self-pay | Admitting: Cardiology

## 2023-05-16 MED ORDER — HYDROXYCHLOROQUINE SULFATE 200 MG PO TABS
200.0000 mg | ORAL_TABLET | Freq: Two times a day (BID) | ORAL | 1 refills | Status: DC
  Filled 2023-05-16: qty 180, 90d supply, fill #0
  Filled 2023-08-09: qty 180, 90d supply, fill #1

## 2023-05-16 MED FILL — Diltiazem HCl Coated Beads Cap ER 24HR 120 MG: ORAL | 90 days supply | Qty: 90 | Fill #0 | Status: AC

## 2023-05-17 ENCOUNTER — Other Ambulatory Visit: Payer: Self-pay

## 2023-05-17 ENCOUNTER — Other Ambulatory Visit (HOSPITAL_BASED_OUTPATIENT_CLINIC_OR_DEPARTMENT_OTHER): Payer: Self-pay

## 2023-05-17 ENCOUNTER — Other Ambulatory Visit (HOSPITAL_COMMUNITY): Payer: Self-pay

## 2023-05-17 MED ORDER — ATORVASTATIN CALCIUM 40 MG PO TABS
40.0000 mg | ORAL_TABLET | Freq: Every day | ORAL | 0 refills | Status: DC
  Filled 2023-05-17: qty 90, 90d supply, fill #0

## 2023-06-03 ENCOUNTER — Other Ambulatory Visit (HOSPITAL_COMMUNITY): Payer: Self-pay

## 2023-07-15 DIAGNOSIS — C4362 Malignant melanoma of left upper limb, including shoulder: Secondary | ICD-10-CM | POA: Diagnosis not present

## 2023-08-05 DIAGNOSIS — Z79899 Other long term (current) drug therapy: Secondary | ICD-10-CM | POA: Diagnosis not present

## 2023-08-05 DIAGNOSIS — Z6839 Body mass index (BMI) 39.0-39.9, adult: Secondary | ICD-10-CM | POA: Diagnosis not present

## 2023-08-05 DIAGNOSIS — M0609 Rheumatoid arthritis without rheumatoid factor, multiple sites: Secondary | ICD-10-CM | POA: Diagnosis not present

## 2023-08-05 DIAGNOSIS — R5382 Chronic fatigue, unspecified: Secondary | ICD-10-CM | POA: Diagnosis not present

## 2023-08-05 DIAGNOSIS — E669 Obesity, unspecified: Secondary | ICD-10-CM | POA: Diagnosis not present

## 2023-08-09 ENCOUNTER — Other Ambulatory Visit: Payer: Self-pay | Admitting: Cardiology

## 2023-08-09 ENCOUNTER — Other Ambulatory Visit (HOSPITAL_COMMUNITY): Payer: Self-pay

## 2023-08-09 DIAGNOSIS — I10 Essential (primary) hypertension: Secondary | ICD-10-CM

## 2023-08-09 DIAGNOSIS — I4891 Unspecified atrial fibrillation: Secondary | ICD-10-CM

## 2023-08-10 ENCOUNTER — Other Ambulatory Visit (HOSPITAL_COMMUNITY): Payer: Self-pay

## 2023-08-12 ENCOUNTER — Other Ambulatory Visit: Payer: Self-pay

## 2023-08-12 ENCOUNTER — Other Ambulatory Visit (HOSPITAL_COMMUNITY): Payer: Self-pay

## 2023-08-12 MED ORDER — DILTIAZEM HCL ER COATED BEADS 120 MG PO CP24
120.0000 mg | ORAL_CAPSULE | Freq: Every day | ORAL | 3 refills | Status: AC
  Filled 2023-08-12: qty 90, 90d supply, fill #0
  Filled 2023-11-12: qty 90, 90d supply, fill #1
  Filled 2024-02-10: qty 90, 90d supply, fill #2

## 2023-08-12 MED ORDER — ATORVASTATIN CALCIUM 40 MG PO TABS
40.0000 mg | ORAL_TABLET | Freq: Every day | ORAL | 0 refills | Status: DC
  Filled 2023-08-12: qty 90, 90d supply, fill #0

## 2023-08-13 ENCOUNTER — Other Ambulatory Visit: Payer: Self-pay

## 2023-08-13 ENCOUNTER — Other Ambulatory Visit (HOSPITAL_COMMUNITY): Payer: Self-pay

## 2023-08-13 MED FILL — Apixaban Tab 5 MG: ORAL | 90 days supply | Qty: 180 | Fill #0 | Status: AC

## 2023-08-13 MED FILL — Metoprolol Tartrate Tab 100 MG: ORAL | 90 days supply | Qty: 180 | Fill #0 | Status: AC

## 2023-08-15 DIAGNOSIS — L570 Actinic keratosis: Secondary | ICD-10-CM | POA: Diagnosis not present

## 2023-08-15 DIAGNOSIS — Z85820 Personal history of malignant melanoma of skin: Secondary | ICD-10-CM | POA: Diagnosis not present

## 2023-08-15 DIAGNOSIS — D225 Melanocytic nevi of trunk: Secondary | ICD-10-CM | POA: Diagnosis not present

## 2023-08-15 DIAGNOSIS — L820 Inflamed seborrheic keratosis: Secondary | ICD-10-CM | POA: Diagnosis not present

## 2023-08-15 DIAGNOSIS — C44311 Basal cell carcinoma of skin of nose: Secondary | ICD-10-CM | POA: Diagnosis not present

## 2023-08-15 DIAGNOSIS — Z1283 Encounter for screening for malignant neoplasm of skin: Secondary | ICD-10-CM | POA: Diagnosis not present

## 2023-08-15 DIAGNOSIS — X32XXXD Exposure to sunlight, subsequent encounter: Secondary | ICD-10-CM | POA: Diagnosis not present

## 2023-08-15 DIAGNOSIS — Z08 Encounter for follow-up examination after completed treatment for malignant neoplasm: Secondary | ICD-10-CM | POA: Diagnosis not present

## 2023-08-22 ENCOUNTER — Other Ambulatory Visit (HOSPITAL_COMMUNITY): Payer: Self-pay

## 2023-08-28 DIAGNOSIS — E1165 Type 2 diabetes mellitus with hyperglycemia: Secondary | ICD-10-CM | POA: Diagnosis not present

## 2023-08-28 DIAGNOSIS — E782 Mixed hyperlipidemia: Secondary | ICD-10-CM | POA: Diagnosis not present

## 2023-08-30 DIAGNOSIS — I4891 Unspecified atrial fibrillation: Secondary | ICD-10-CM | POA: Diagnosis not present

## 2023-08-30 DIAGNOSIS — I10 Essential (primary) hypertension: Secondary | ICD-10-CM | POA: Diagnosis not present

## 2023-08-30 DIAGNOSIS — E041 Nontoxic single thyroid nodule: Secondary | ICD-10-CM | POA: Diagnosis not present

## 2023-08-30 DIAGNOSIS — N289 Disorder of kidney and ureter, unspecified: Secondary | ICD-10-CM | POA: Diagnosis not present

## 2023-08-30 DIAGNOSIS — M79642 Pain in left hand: Secondary | ICD-10-CM | POA: Diagnosis not present

## 2023-08-30 DIAGNOSIS — M069 Rheumatoid arthritis, unspecified: Secondary | ICD-10-CM | POA: Diagnosis not present

## 2023-08-30 DIAGNOSIS — M79641 Pain in right hand: Secondary | ICD-10-CM | POA: Diagnosis not present

## 2023-08-30 DIAGNOSIS — M25511 Pain in right shoulder: Secondary | ICD-10-CM | POA: Diagnosis not present

## 2023-08-30 DIAGNOSIS — E1165 Type 2 diabetes mellitus with hyperglycemia: Secondary | ICD-10-CM | POA: Diagnosis not present

## 2023-08-30 DIAGNOSIS — E782 Mixed hyperlipidemia: Secondary | ICD-10-CM | POA: Diagnosis not present

## 2023-08-30 DIAGNOSIS — E875 Hyperkalemia: Secondary | ICD-10-CM | POA: Diagnosis not present

## 2023-08-30 DIAGNOSIS — M25512 Pain in left shoulder: Secondary | ICD-10-CM | POA: Diagnosis not present

## 2023-09-23 DIAGNOSIS — M546 Pain in thoracic spine: Secondary | ICD-10-CM | POA: Diagnosis not present

## 2023-09-23 DIAGNOSIS — M542 Cervicalgia: Secondary | ICD-10-CM | POA: Diagnosis not present

## 2023-09-23 DIAGNOSIS — M9902 Segmental and somatic dysfunction of thoracic region: Secondary | ICD-10-CM | POA: Diagnosis not present

## 2023-09-23 DIAGNOSIS — M9901 Segmental and somatic dysfunction of cervical region: Secondary | ICD-10-CM | POA: Diagnosis not present

## 2023-11-10 ENCOUNTER — Other Ambulatory Visit (HOSPITAL_COMMUNITY): Payer: Self-pay

## 2023-11-11 ENCOUNTER — Other Ambulatory Visit: Payer: Self-pay

## 2023-11-11 ENCOUNTER — Other Ambulatory Visit (HOSPITAL_COMMUNITY): Payer: Self-pay

## 2023-11-11 MED ORDER — ATORVASTATIN CALCIUM 40 MG PO TABS
40.0000 mg | ORAL_TABLET | Freq: Every day | ORAL | 0 refills | Status: DC
  Filled 2023-11-11: qty 90, 90d supply, fill #0

## 2023-11-11 MED ORDER — LOSARTAN POTASSIUM 25 MG PO TABS
12.5000 mg | ORAL_TABLET | Freq: Every day | ORAL | 2 refills | Status: AC
  Filled 2023-11-11: qty 45, 90d supply, fill #0
  Filled 2024-02-10: qty 45, 90d supply, fill #1

## 2023-11-12 ENCOUNTER — Other Ambulatory Visit: Payer: Self-pay

## 2023-11-12 ENCOUNTER — Other Ambulatory Visit (HOSPITAL_COMMUNITY): Payer: Self-pay

## 2023-11-12 MED ORDER — HYDROXYCHLOROQUINE SULFATE 200 MG PO TABS
200.0000 mg | ORAL_TABLET | Freq: Two times a day (BID) | ORAL | 0 refills | Status: DC
  Filled 2023-11-12: qty 180, 90d supply, fill #0

## 2023-11-12 MED FILL — Apixaban Tab 5 MG: ORAL | 90 days supply | Qty: 180 | Fill #1 | Status: AC

## 2023-11-12 MED FILL — Metoprolol Tartrate Tab 100 MG: ORAL | 90 days supply | Qty: 180 | Fill #1 | Status: AC

## 2023-12-06 DIAGNOSIS — M9903 Segmental and somatic dysfunction of lumbar region: Secondary | ICD-10-CM | POA: Diagnosis not present

## 2023-12-06 DIAGNOSIS — M546 Pain in thoracic spine: Secondary | ICD-10-CM | POA: Diagnosis not present

## 2023-12-06 DIAGNOSIS — M9901 Segmental and somatic dysfunction of cervical region: Secondary | ICD-10-CM | POA: Diagnosis not present

## 2023-12-06 DIAGNOSIS — M542 Cervicalgia: Secondary | ICD-10-CM | POA: Diagnosis not present

## 2023-12-06 DIAGNOSIS — M9902 Segmental and somatic dysfunction of thoracic region: Secondary | ICD-10-CM | POA: Diagnosis not present

## 2023-12-09 DIAGNOSIS — E782 Mixed hyperlipidemia: Secondary | ICD-10-CM | POA: Diagnosis not present

## 2023-12-12 ENCOUNTER — Encounter: Payer: Self-pay | Admitting: Internal Medicine

## 2023-12-12 DIAGNOSIS — M79642 Pain in left hand: Secondary | ICD-10-CM | POA: Diagnosis not present

## 2023-12-12 DIAGNOSIS — N289 Disorder of kidney and ureter, unspecified: Secondary | ICD-10-CM | POA: Diagnosis not present

## 2023-12-12 DIAGNOSIS — E1165 Type 2 diabetes mellitus with hyperglycemia: Secondary | ICD-10-CM | POA: Diagnosis not present

## 2023-12-12 DIAGNOSIS — R251 Tremor, unspecified: Secondary | ICD-10-CM | POA: Diagnosis not present

## 2023-12-12 DIAGNOSIS — Z0001 Encounter for general adult medical examination with abnormal findings: Secondary | ICD-10-CM | POA: Diagnosis not present

## 2023-12-12 DIAGNOSIS — Z0000 Encounter for general adult medical examination without abnormal findings: Secondary | ICD-10-CM | POA: Diagnosis not present

## 2023-12-12 DIAGNOSIS — M79641 Pain in right hand: Secondary | ICD-10-CM | POA: Diagnosis not present

## 2023-12-12 DIAGNOSIS — E782 Mixed hyperlipidemia: Secondary | ICD-10-CM | POA: Diagnosis not present

## 2023-12-12 DIAGNOSIS — M25511 Pain in right shoulder: Secondary | ICD-10-CM | POA: Diagnosis not present

## 2023-12-12 DIAGNOSIS — I10 Essential (primary) hypertension: Secondary | ICD-10-CM | POA: Diagnosis not present

## 2023-12-12 DIAGNOSIS — M069 Rheumatoid arthritis, unspecified: Secondary | ICD-10-CM | POA: Diagnosis not present

## 2023-12-12 DIAGNOSIS — I4891 Unspecified atrial fibrillation: Secondary | ICD-10-CM | POA: Diagnosis not present

## 2023-12-13 ENCOUNTER — Other Ambulatory Visit (HOSPITAL_COMMUNITY): Payer: Self-pay

## 2023-12-24 LAB — COLOGUARD: COLOGUARD: NEGATIVE

## 2024-02-10 ENCOUNTER — Other Ambulatory Visit: Payer: Self-pay | Admitting: Cardiology

## 2024-02-10 ENCOUNTER — Other Ambulatory Visit: Payer: Self-pay

## 2024-02-10 ENCOUNTER — Other Ambulatory Visit (HOSPITAL_COMMUNITY): Payer: Self-pay

## 2024-02-10 DIAGNOSIS — I4891 Unspecified atrial fibrillation: Secondary | ICD-10-CM

## 2024-02-10 DIAGNOSIS — E1165 Type 2 diabetes mellitus with hyperglycemia: Secondary | ICD-10-CM | POA: Insufficient documentation

## 2024-02-10 DIAGNOSIS — I10 Essential (primary) hypertension: Secondary | ICD-10-CM

## 2024-02-10 DIAGNOSIS — Z85820 Personal history of malignant melanoma of skin: Secondary | ICD-10-CM | POA: Insufficient documentation

## 2024-02-10 DIAGNOSIS — G9332 Myalgic encephalomyelitis/chronic fatigue syndrome: Secondary | ICD-10-CM | POA: Insufficient documentation

## 2024-02-10 MED ORDER — DILTIAZEM HCL ER COATED BEADS 120 MG PO CP24
120.0000 mg | ORAL_CAPSULE | Freq: Every day | ORAL | 0 refills | Status: AC
  Filled 2024-02-10: qty 90, 90d supply, fill #0

## 2024-02-10 MED ORDER — APIXABAN 5 MG PO TABS
5.0000 mg | ORAL_TABLET | Freq: Two times a day (BID) | ORAL | 1 refills | Status: AC
  Filled 2024-02-10: qty 180, 90d supply, fill #0

## 2024-02-10 MED ORDER — OMEPRAZOLE 20 MG PO CPDR
20.0000 mg | DELAYED_RELEASE_CAPSULE | Freq: Every day | ORAL | 3 refills | Status: AC
  Filled 2024-02-10: qty 90, 90d supply, fill #0

## 2024-02-10 MED ORDER — METOPROLOL TARTRATE 100 MG PO TABS
100.0000 mg | ORAL_TABLET | Freq: Two times a day (BID) | ORAL | 1 refills | Status: AC
  Filled 2024-02-10: qty 180, 90d supply, fill #0

## 2024-02-10 MED ORDER — ATORVASTATIN CALCIUM 40 MG PO TABS
40.0000 mg | ORAL_TABLET | Freq: Every day | ORAL | 0 refills | Status: AC
  Filled 2024-02-10: qty 90, 90d supply, fill #0

## 2024-02-10 MED ORDER — HYDROXYCHLOROQUINE SULFATE 200 MG PO TABS
200.0000 mg | ORAL_TABLET | Freq: Two times a day (BID) | ORAL | 1 refills | Status: AC
  Filled 2024-02-10: qty 180, 90d supply, fill #0

## 2024-02-10 MED ORDER — SYNJARDY XR 25-1000 MG PO TB24: 1.0000 | ORAL_TABLET | Freq: Every day | ORAL | 3 refills | Status: AC

## 2024-02-10 NOTE — Telephone Encounter (Signed)
 Prescription refill request for Eliquis  received. Indication: a flutter Last office visit: 12/31/22, overdue.   Scr: 1.19 12/10/23 epic Age: 74 Weight: 116kg  Message sent to scheduling

## 2024-02-11 ENCOUNTER — Other Ambulatory Visit (HOSPITAL_COMMUNITY): Payer: Self-pay

## 2024-02-11 ENCOUNTER — Other Ambulatory Visit: Payer: Self-pay

## 2024-03-17 ENCOUNTER — Ambulatory Visit: Admitting: Nurse Practitioner
